# Patient Record
Sex: Female | Born: 1970 | Race: White | Hispanic: No | Marital: Married | Smoking: Never smoker
Health system: Southern US, Community
[De-identification: ages and names within clinical notes are randomized; demographics above are authoritative.]

## PROBLEM LIST (undated history)

## (undated) DIAGNOSIS — C801 Malignant (primary) neoplasm, unspecified: Secondary | ICD-10-CM

## (undated) DIAGNOSIS — Z8042 Family history of malignant neoplasm of prostate: Secondary | ICD-10-CM

## (undated) DIAGNOSIS — Z923 Personal history of irradiation: Secondary | ICD-10-CM

## (undated) HISTORY — DX: Family history of malignant neoplasm of prostate: Z80.42

---

## 2008-02-16 HISTORY — PX: LAPAROSCOPIC CHOLECYSTECTOMY: SUR755

## 2012-01-05 ENCOUNTER — Other Ambulatory Visit: Payer: Self-pay | Admitting: Obstetrics & Gynecology

## 2012-01-05 DIAGNOSIS — Z1231 Encounter for screening mammogram for malignant neoplasm of breast: Secondary | ICD-10-CM

## 2012-03-02 ENCOUNTER — Ambulatory Visit
Admission: RE | Admit: 2012-03-02 | Discharge: 2012-03-02 | Disposition: A | Discharge status: Home / Self Care | Payer: Commercial Indemnity | Source: Ambulatory Visit | Attending: Obstetrics & Gynecology | Admitting: Obstetrics & Gynecology

## 2012-03-02 DIAGNOSIS — Z1231 Encounter for screening mammogram for malignant neoplasm of breast: Secondary | ICD-10-CM

## 2013-03-01 ENCOUNTER — Other Ambulatory Visit: Payer: Self-pay

## 2013-03-01 DIAGNOSIS — Z1231 Encounter for screening mammogram for malignant neoplasm of breast: Secondary | ICD-10-CM

## 2013-03-23 ENCOUNTER — Ambulatory Visit
Admission: RE | Admit: 2013-03-23 | Discharge: 2013-03-23 | Disposition: A | Discharge status: Home / Self Care | Payer: Commercial Indemnity | Source: Ambulatory Visit

## 2013-03-23 DIAGNOSIS — Z1231 Encounter for screening mammogram for malignant neoplasm of breast: Secondary | ICD-10-CM

## 2017-06-15 DIAGNOSIS — C801 Malignant (primary) neoplasm, unspecified: Secondary | ICD-10-CM

## 2017-06-15 HISTORY — PX: BREAST BIOPSY: SHX20

## 2017-06-15 HISTORY — DX: Malignant (primary) neoplasm, unspecified: C80.1

## 2017-06-16 ENCOUNTER — Other Ambulatory Visit: Payer: Self-pay | Admitting: Obstetrics & Gynecology

## 2017-06-16 DIAGNOSIS — R928 Other abnormal and inconclusive findings on diagnostic imaging of breast: Secondary | ICD-10-CM

## 2017-06-22 ENCOUNTER — Ambulatory Visit
Admission: RE | Admit: 2017-06-22 | Discharge: 2017-06-22 | Disposition: A | Discharge status: Home / Self Care | Payer: Commercial Indemnity | Source: Ambulatory Visit | Attending: Obstetrics & Gynecology | Admitting: Obstetrics & Gynecology

## 2017-06-22 ENCOUNTER — Ambulatory Visit
Admission: RE | Admit: 2017-06-22 | Discharge: 2017-06-22 | Disposition: A | Discharge status: Home / Self Care | Payer: 59 | Source: Ambulatory Visit | Attending: Obstetrics & Gynecology | Admitting: Obstetrics & Gynecology

## 2017-06-22 ENCOUNTER — Other Ambulatory Visit: Payer: Self-pay | Admitting: Obstetrics & Gynecology

## 2017-06-22 DIAGNOSIS — R928 Other abnormal and inconclusive findings on diagnostic imaging of breast: Secondary | ICD-10-CM

## 2017-06-22 DIAGNOSIS — N6310 Unspecified lump in the right breast, unspecified quadrant: Secondary | ICD-10-CM

## 2017-06-29 ENCOUNTER — Ambulatory Visit
Admission: RE | Admit: 2017-06-29 | Discharge: 2017-06-29 | Disposition: A | Discharge status: Home / Self Care | Payer: 59 | Source: Ambulatory Visit | Attending: Obstetrics & Gynecology | Admitting: Obstetrics & Gynecology

## 2017-06-29 ENCOUNTER — Other Ambulatory Visit: Payer: Self-pay | Admitting: Obstetrics & Gynecology

## 2017-06-29 DIAGNOSIS — N6310 Unspecified lump in the right breast, unspecified quadrant: Secondary | ICD-10-CM

## 2017-07-01 ENCOUNTER — Telehealth: Payer: Self-pay | Admitting: Hematology and Oncology

## 2017-07-01 ENCOUNTER — Encounter: Payer: Self-pay | Admitting: *Deleted

## 2017-07-01 NOTE — Telephone Encounter (Signed)
LVM for patient in reference to morning [facility] appointment on 5/22, packet mailed to patient

## 2017-07-05 ENCOUNTER — Other Ambulatory Visit: Payer: Self-pay

## 2017-07-06 ENCOUNTER — Encounter: Payer: Self-pay | Admitting: Hematology and Oncology

## 2017-07-06 ENCOUNTER — Inpatient Hospital Stay: Payer: 59 | Attending: Hematology and Oncology | Admitting: Hematology and Oncology

## 2017-07-06 ENCOUNTER — Ambulatory Visit
Admission: RE | Admit: 2017-07-06 | Discharge: 2017-07-06 | Disposition: A | Discharge status: Home / Self Care | Payer: 59 | Source: Ambulatory Visit | Attending: Radiation Oncology | Admitting: Radiation Oncology

## 2017-07-06 ENCOUNTER — Ambulatory Visit: Payer: 59 | Attending: Surgery | Admitting: Physical Therapy

## 2017-07-06 ENCOUNTER — Other Ambulatory Visit: Payer: Self-pay | Admitting: *Deleted

## 2017-07-06 ENCOUNTER — Ambulatory Visit: Payer: Self-pay | Admitting: Surgery

## 2017-07-06 ENCOUNTER — Encounter: Payer: Self-pay | Admitting: Radiation Oncology

## 2017-07-06 ENCOUNTER — Encounter: Payer: Self-pay | Admitting: Physical Therapy

## 2017-07-06 ENCOUNTER — Other Ambulatory Visit: Payer: Self-pay

## 2017-07-06 ENCOUNTER — Encounter: Payer: Self-pay | Admitting: *Deleted

## 2017-07-06 ENCOUNTER — Inpatient Hospital Stay: Payer: 59

## 2017-07-06 DIAGNOSIS — C50211 Malignant neoplasm of upper-inner quadrant of right female breast: Secondary | ICD-10-CM

## 2017-07-06 DIAGNOSIS — Z170 Estrogen receptor positive status [ER+]: Secondary | ICD-10-CM | POA: Insufficient documentation

## 2017-07-06 DIAGNOSIS — R293 Abnormal posture: Secondary | ICD-10-CM | POA: Insufficient documentation

## 2017-07-06 DIAGNOSIS — C50911 Malignant neoplasm of unspecified site of right female breast: Secondary | ICD-10-CM

## 2017-07-06 DIAGNOSIS — Z79899 Other long term (current) drug therapy: Secondary | ICD-10-CM | POA: Insufficient documentation

## 2017-07-06 LAB — CMP (CANCER CENTER ONLY)
ALBUMIN: 3.6 g/dL (ref 3.5–5.0)
ALK PHOS: 81 U/L (ref 40–150)
ALT: 25 U/L (ref 0–55)
AST: 25 U/L (ref 5–34)
Anion gap: 11 (ref 3–11)
BUN: 10 mg/dL (ref 7–26)
CALCIUM: 8.9 mg/dL (ref 8.4–10.4)
CO2: 22 mmol/L (ref 22–29)
Chloride: 109 mmol/L (ref 98–109)
Creatinine: 0.9 mg/dL (ref 0.60–1.10)
GFR, Estimated: >60 mL/min (ref >60)
GLUCOSE: 103 mg/dL (ref 70–140)
POTASSIUM: 4.4 mmol/L (ref 3.5–5.1)
SODIUM: 142 mmol/L (ref 136–145)
TOTAL PROTEIN: 6.8 g/dL (ref 6.4–8.3)
Total Bilirubin: 0.4 mg/dL (ref 0.2–1.2)

## 2017-07-06 LAB — CBC WITH DIFFERENTIAL (CANCER CENTER ONLY)
Basophils Absolute: 0 10*3/uL (ref 0.0–0.1)
Basophils Relative: 1 %
EOS ABS: 0.1 10*3/uL (ref 0.0–0.5)
EOS PCT: 1 %
HCT: 40.4 % (ref 34.8–46.6)
Hemoglobin: 13.3 g/dL (ref 11.6–15.9)
LYMPHS PCT: 24 %
Lymphs Abs: 1.9 10*3/uL (ref 0.9–3.3)
MCH: 27.9 pg (ref 25.1–34.0)
MCHC: 32.9 g/dL (ref 31.5–36.0)
MCV: 84.9 fL (ref 79.5–101.0)
MONO ABS: 0.3 10*3/uL (ref 0.1–0.9)
MONOS PCT: 4 %
NEUTROS PCT: 70 %
Neutro Abs: 5.6 10*3/uL (ref 1.5–6.5)
PLATELETS: 258 10*3/uL (ref 145–400)
RBC: 4.76 MIL/uL (ref 3.70–5.45)
RDW: 13.9 % (ref 11.2–14.5)
WBC Count: 8 10*3/uL (ref 3.9–10.3)

## 2017-07-06 NOTE — Progress Notes (Signed)
Clinical Social Work [place] Psychosocial Distress Screening [place]  Patient completed distress screening protocol and scored a 4 on the Psychosocial Distress Thermometer which indicates mild distress. Clinical Social Worker met with patient and patients husband in [facility] to assess for distress and other psychosocial needs. Patient stated she was feeling overwhelmed but felt "better" after meeting with the treatment team and getting more information on her treatment plan. CSW and patient discussed common feeling and emotions when being diagnosed with cancer, and the importance of support during treatment. CSW informed patient of the support team and support services at [facility]. CSW provided contact information and encouraged patient to call with any questions or concerns.  ONCBCN DISTRESS SCREENING 07/06/2017  Screening Type Initial Screening  Distress experienced in past week (1-10) 4  Practical problem type Childcare  Emotional problem type Nervousness/Anxiety;Adjusting to illness  Information Concerns Type Lack of info about treatment  Physician notified of physical symptoms Yes     Johnnye Lana, MSW, LCSW, OSW-C Clinical Social Worker [place] [phone]

## 2017-07-06 NOTE — Progress Notes (Signed)
Nutrition Assessment  Reason for Assessment:  Pt seen in Breast Clinic  ASSESSMENT:   47 year old female with new diagnosis of breast cancer.  Past medical history reviewed  Patient reports normal appetite  Medications:  reviewed  Labs: reviewed  Anthropometrics:   Height: 62.5 inches Weight: 179 lb 9.6 oz BMI: 32   NUTRITION DIAGNOSIS: Food and nutrition related knowledge deficit related to new diagnosis of breast cancer as evidenced by no prior need for nutrition related information.  INTERVENTION:   Discussed and provided packet of information regarding nutritional tips for breast cancer patients.  Questions answered.  Teachback method used.  Contact information provided and patient knows to contact me with questions/concerns.    MONITORING, EVALUATION, and GOAL: Pt will consume a healthy plant based diet to maintain lean body mass throughout treatment.   Virgia Kelner B. Zenia Resides, [place], [place] Registered Dietitian [phone] (pager)

## 2017-07-06 NOTE — Progress Notes (Signed)
Radiation Oncology         [phone] ________________________________  Initial Outpatient Consultation  Name: Melissa [name] MRN: [id number]  Date: 07/06/2017  DOB: 08/19/1970  [id number], [name], MD  Alphonsa Overall, MD   REFERRING PHYSICIAN: Alphonsa Overall, MD  DIAGNOSIS:    ICD-10-CM   1. 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 female who presented with screening detected right breast mass. Diagnostic mammogram and ultrasound of the right breast on 06/22/17 showed a 7 mm mass in the 2:00 location, 8 cm from the nipple. Right axilla was negative on ultrasound. Biopsy of the mass on 06/29/17 revealed invasive ductal carcinoma with characteristics as described above in the diagnosis.  On review of systems, the patient is positive for back pain. She wears contacts.  GYN/OB History: She had her first menstrual period at age 73. She is still having periods. Approximate date of her last period is 04/11/17. Her periods are not regular. She has carried one child to term at age 61. She is not currently trying to get pregnant. She has used birth control pills as contraception for the past 5 years. Will stop this now.  PREVIOUS RADIATION THERAPY: No  PAST MEDICAL HISTORY:  has no past medical history on file.    PAST SURGICAL HISTORY: Past Surgical History:  Procedure Laterality Date  . CESAREAN SECTION    . CHOLECYSTECTOMY      FAMILY HISTORY: family history includes Prostate cancer in her father.  SOCIAL HISTORY:  reports that she has never smoked. She has never used smokeless tobacco. She reports that she drinks about 0.6 oz of alcohol per week. She reports that she does not use  drugs. Married with 1 son. Homemaker.  ALLERGIES: Patient has no known allergies.  MEDICATIONS:  Current Outpatient Medications  Medication Sig Dispense Refill  . acetaminophen (TYLENOL) 500 MG tablet Take 500 mg by mouth every 6 (six) hours as needed.    . fexofenadine (ALLEGRA) 180 MG tablet Take 180 mg by mouth daily as needed for allergies or rhinitis.    . naproxen sodium (ALEVE) 220 MG tablet Take 220 mg by mouth as needed.    . Norethindrone-Ethinyl Estradiol-Fe Biphas (LO LOESTRIN FE) 1 MG-10 MCG / 10 MCG tablet Take 1 tablet by mouth daily.     No current facility-administered medications for this encounter.     REVIEW OF SYSTEMS: A 10+ POINT REVIEW OF SYSTEMS WAS OBTAINED including neurology, dermatology, psychiatry, cardiac, respiratory, lymph, extremities, GI, GU, Musculoskeletal, constitutional, breasts, reproductive, HEENT.  All pertinent positives are noted in the HPI.  All others are negative.   PHYSICAL EXAM:  Vitals with BMI 07/06/2017  Height 5' 2.5"  Weight 179 lbs 10 oz  BMI 38.75  Systolic 643  Diastolic 92  Pulse 71  Respirations 17   General: Alert and oriented, in no acute distress. HEENT: Head is normocephalic. Extraocular movements are intact. Oropharynx is clear. Neck: Neck is supple, no palpable cervical or supraclavicular lymphadenopathy. Heart: Regular in rate and rhythm with no murmurs, rubs, or gallops. Chest: Clear to auscultation bilaterally, with no rhonchi, wheezes, or rales. Abdomen: Soft, nontender, nondistended, with no rigidity or guarding. Extremities: No cyanosis or edema.  Lymphatics: see Neck Exam Skin: No concerning lesions. Musculoskeletal: Symmetric strength and muscle tone throughout. Neurologic: Cranial nerves II through XII are grossly intact. No obvious focalities. Speech is fluent. Coordination is intact. Psychiatric: Judgment and insight are intact. Affect is appropriate. Breasts: Post-biopsy bruising in the medial right  breast. No palpable masses.    ECOG = 0  0 - Asymptomatic (Fully active, able to carry on all predisease activities without restriction)  1 - Symptomatic but completely ambulatory (Restricted in physically strenuous activity but ambulatory and able to carry out work of a light or sedentary nature. For example, light housework, office work)  2 - Symptomatic, <50% in bed during the day (Ambulatory and capable of all self care but unable to carry out any work activities. Up and about more than 50% of waking hours)  3 - Symptomatic, >50% in bed, but not bedbound (Capable of only limited self-care, confined to bed or chair 50% or more of waking hours)  4 - Bedbound (Completely disabled. Cannot carry on any self-care. Totally confined to bed or chair)  5 - Death   Eustace Pen MM, Creech RH, Tormey DC, et al. [phone]). "Toxicity and response criteria of the [facility]". [place] Oncol. 5 (6): 649-55   LABORATORY DATA:  Lab Results  Component Value Date   WBC 8.0 07/06/2017   HGB 13.3 07/06/2017   HCT 40.4 07/06/2017   MCV 84.9 07/06/2017   PLT 258 07/06/2017   CMP     Component Value Date/Time   NA 142 07/06/2017 0820   K 4.4 07/06/2017 0820   CL 109 07/06/2017 0820   CO2 22 07/06/2017 0820   GLUCOSE 103 07/06/2017 0820   BUN 10 07/06/2017 0820   CREATININE 0.90 07/06/2017 0820   CALCIUM 8.9 07/06/2017 0820   PROT 6.8 07/06/2017 0820   ALBUMIN 3.6 07/06/2017 0820   AST 25 07/06/2017 0820   ALT 25 07/06/2017 0820   ALKPHOS 81 07/06/2017 0820   BILITOT 0.4 07/06/2017 0820   GFRNONAA >60 07/06/2017 0820   GFRAA >60 07/06/2017 0820         RADIOGRAPHY: US Breast Ltd Uni Right Inc Axilla  Result Date: 06/22/2017 CLINICAL DATA:  Patient returns after screening study for evaluation of a possible RIGHT breast mass. EXAM: DIGITAL DIAGNOSTIC RIGHT MAMMOGRAM WITH CAD AND TOMO ULTRASOUND RIGHT BREAST COMPARISON:  06/15/2017 and earlier ACR Breast Density Category  b: There are scattered areas of fibroglandular density. FINDINGS: Additional 2-D and 3-D images are performed. These views confirm presence of a spiculated mass in the MEDIAL portion of the RIGHT breast. Mammographic images were processed with CAD. On physical exam, I palpate no abnormality in the MEDIAL aspect of the RIGHT breast. Targeted ultrasound is performed, showing an irregular hypoechoic mass with posterior acoustic shadowing in the 2 o'clock location of the RIGHT breast 8 centimeters from the nipple. There is no significant internal blood flow. Mass measures 0.7 x 0.6 x 0.6 centimeters. Evaluation of the RIGHT axilla shows lymph nodes with normal morphology. IMPRESSION: Suspicious mass in the 2 o'clock location of the RIGHT breast. RECOMMENDATION: Ultrasound-guided core biopsy of the RIGHT breast, scheduled for the patient I have discussed the findings and recommendations with the patient. Results were also provided in writing at the conclusion of the visit. If applicable, a reminder letter will be sent to the patient regarding the next appointment. BI-RADS CATEGORY  4: Suspicious. Electronically Signed   By: Nolon Nations M.D.   On:  06/22/2017 15:58   Mm Diag Breast Tomo Uni Right  Result Date: 06/22/2017 CLINICAL DATA:  Patient returns after screening study for evaluation of a possible RIGHT breast mass. EXAM: DIGITAL DIAGNOSTIC RIGHT MAMMOGRAM WITH CAD AND TOMO ULTRASOUND RIGHT BREAST COMPARISON:  06/15/2017 and earlier ACR Breast Density Category b: There are scattered areas of fibroglandular density. FINDINGS: Additional 2-D and 3-D images are performed. These views confirm presence of a spiculated mass in the MEDIAL portion of the RIGHT breast. Mammographic images were processed with CAD. On physical exam, I palpate no abnormality in the MEDIAL aspect of the RIGHT breast. Targeted ultrasound is performed, showing an irregular hypoechoic mass with posterior acoustic shadowing in the 2 o'clock  location of the RIGHT breast 8 centimeters from the nipple. There is no significant internal blood flow. Mass measures 0.7 x 0.6 x 0.6 centimeters. Evaluation of the RIGHT axilla shows lymph nodes with normal morphology. IMPRESSION: Suspicious mass in the 2 o'clock location of the RIGHT breast. RECOMMENDATION: Ultrasound-guided core biopsy of the RIGHT breast, scheduled for the patient I have discussed the findings and recommendations with the patient. Results were also provided in writing at the conclusion of the visit. If applicable, a reminder letter will be sent to the patient regarding the next appointment. BI-RADS CATEGORY  4: Suspicious. Electronically Signed   By: Nolon Nations M.D.   On: 06/22/2017 15:58   Mm Clip Placement Right  Result Date: 06/29/2017 CLINICAL DATA:  Evaluate biopsy marker EXAM: DIAGNOSTIC RIGHT MAMMOGRAM POST ULTRASOUND BIOPSY COMPARISON:  Previous exam(s). FINDINGS: Mammographic images were obtained following ultrasound guided biopsy of a right breast mass. The biopsy clip is in close proximity to the biopsied mass. It is difficult to see the mass by the end of the study in the clip is likely not directly in the mass but within 1 cm. IMPRESSION: The biopsy clip is within 1 cm of the biopsied mass. The clip is not within the mass itself as the mass was very difficult to see by the end of the study. Final Assessment: Post Procedure Mammograms for Marker Placement Electronically Signed   By: Dorise Bullion III M.D   On: 06/29/2017 13:42   [place] Rt Breast Bx W Loc Dev 1st Lesion Img Bx Spec US Guide  Addendum Date: 06/30/2017   ADDENDUM REPORT: 06/30/2017 14:10 ADDENDUM: Pathology revealed GRADE I INVASIVE DUCTAL CARCINOMA Right breast, 2 o'clock, 8 cmfn. This was found to be concordant by Dr. Dorise Bullion. Pathology results were discussed with the patient by telephone. The patient reported doing well after the biopsy with tenderness at the site. Post biopsy instructions and  care were reviewed and questions were answered. The patient was encouraged to call [facility] for any additional concerns. The patient was referred to [facility] on Jul 06, 2017. Pathology results reported by Terie Purser, RN on 06/30/2017. Electronically Signed   By: Dorise Bullion III M.D   On: 06/30/2017 14:10   Result Date: 06/30/2017 CLINICAL DATA:  Spiculated mass in the right breast EXAM: ULTRASOUND GUIDED RIGHT BREAST CORE NEEDLE BIOPSY COMPARISON:  Previous exam(s). FINDINGS: I met with the patient and we discussed the procedure of ultrasound-guided biopsy, including benefits and alternatives. We discussed the high likelihood of a successful procedure. We discussed the risks of the procedure, including infection, bleeding, tissue injury, clip migration, and inadequate sampling. Informed written consent was given. The usual time-out protocol was performed  immediately prior to the procedure. Lesion quadrant: Medial superior Using sterile technique and 1% Lidocaine as local anesthetic, under direct ultrasound visualization, a 12 gauge spring-loaded device was used to perform biopsy of the spiculated mass in the medial superior right breast using a medial approach. At the conclusion of the procedure a ribbon shaped tissue marker clip was deployed into the biopsy cavity. Follow up 2 view mammogram was performed and dictated separately. IMPRESSION: Ultrasound guided biopsy of a right breast mass. No apparent complications. Electronically Signed: By: Dorise Bullion III M.D On: 06/29/2017 14:16      IMPRESSION/PLAN: Right Breast Cancer   She has been discussed at our multidisciplinary tumor board.  The consensus is that she would be a good candidate for breast conservation. I talked to her about the option of a mastectomy and informed her that her expected overall survival would be equivalent between  mastectomy and breast conservation, based upon randomized controlled data. She is enthusiastic about breast conservation. Given her young age, she will also see genetics tomorrow for testing.  It was a pleasure meeting the patient today. We discussed the risks, benefits, and side effects of radiotherapy. I recommend radiotherapy to the right breast to reduce her risk of locoregional recurrence by 2/3.  We discussed that radiation would take approximately 4 weeks to complete and that I would give the patient a few weeks to heal following surgery before starting treatment planning. We spoke about acute effects including skin irritation and fatigue as well as much less common late effects including internal organ injury or irritation. We spoke about the latest technology that is used to minimize the risk of late effects for patients undergoing radiotherapy to the breast or chest wall. No guarantees of treatment were given. The patient is enthusiastic about proceeding with treatment. I look forward to participating in the patient's care.  I will await her referral back to me for postoperative follow-up and eventual CT simulation/treatment planning.  She is also enthusiastic about adjuvant anti-estrogen therapy following radiotherapy and will see Dr. [name] for this.  She will stop her birth control pills.  Advised to use condoms for now, for birth control, and avoid hormonal interventions for this.      __________________________________________   Eppie Gibson, MD  This document serves as a record of services personally performed by Eppie Gibson, MD. It was created on her behalf by Rae Lips, a trained medical scribe. The creation of this record is based on the scribe's personal observations and the provider's statements to them. This document has been checked and approved by the attending provider.

## 2017-07-06 NOTE — Patient Instructions (Signed)

## 2017-07-06 NOTE — Therapy (Signed)
[place], [place], [address] Phone: [phone]   Fax:  [phone]  Physical Therapy Evaluation  Patient Details  Name: Melissa [name] MRN: [id number] Date of Birth: [birth year] Referring Provider: Dr. Alphonsa Overall   Encounter Date: 07/06/2017  PT End of Session - 07/06/17 1117    Visit Number  1    Number of Visits  2    Date for PT Re-Evaluation  08/31/17    PT Start Time  1025    PT Stop Time  1058    PT Time Calculation (min)  33 min    Activity Tolerance  Patient tolerated treatment well    Behavior During Therapy  [facility] for tasks assessed/performed       History reviewed. No pertinent past medical history.  Past Surgical History:  Procedure Laterality Date  . CESAREAN SECTION    . CHOLECYSTECTOMY      There were no vitals filed for this visit.   Subjective Assessment - 07/06/17 1101    Subjective  Patient reports she is here today to be seen by her medical team for her newly diagnosed right breast cancer.    Patient is accompained by:  Family member    Pertinent History  Patient was diagnosed on 06/15/17 with right grade I invasive ductal carcinoma breast cancer. It measures 7 mm and is located in the upper inner quadrant. It is ER/PR positive and HER2 negative with a Ki67 of 5%. She has no other medical problems.    Patient Stated Goals  Reduce lymphedema risk and learn post op shoulder ROM HEP    Currently in Pain?  No/denies         [facility] PT Assessment - 07/06/17 0001      Assessment   Medical Diagnosis  Right breast cancer    Referring Provider  Dr. Alphonsa Overall    Onset Date/Surgical Date  06/15/17    Hand Dominance  Right    Prior Therapy  none      Precautions   Precautions  Other (comment)    Precaution Comments  active cancer      Restrictions   Weight Bearing Restrictions  No      Balance Screen   Has the patient fallen in the past 6 months  No    Has the  patient had a decrease in activity level because of a fear of falling?   No    Is the patient reluctant to leave their home because of a fear of falling?   No      Home Social worker  Private residence    Living Arrangements  Spouse/significant other;Children Husband and 59 y.o. son    Available Help at Discharge  Family      Prior Function   Level of [place]  Unemployed    Leisure  She plays tennis 3-4x/week and walks 30 min once a week      Cognition   Overall Cognitive Status  Within Functional Limits for tasks assessed      Posture/Postural Control   Posture/Postural Control  Postural limitations    Postural Limitations  Rounded Shoulders;Forward head      ROM / Strength   AROM / PROM / Strength  AROM;Strength      AROM   AROM Assessment Site  Shoulder;Cervical    Right/Left Shoulder  Right;Left    Right Shoulder Extension  40 Degrees  Right Shoulder Flexion  148 Degrees    Right Shoulder ABduction  145 Degrees    Right Shoulder Internal Rotation  71 Degrees    Right Shoulder External Rotation  82 Degrees    Left Shoulder Extension  46 Degrees    Left Shoulder Flexion  135 Degrees    Left Shoulder ABduction  146 Degrees    Left Shoulder Internal Rotation  77 Degrees    Left Shoulder External Rotation  80 Degrees    Cervical Flexion  WNL    Cervical Extension  WNL    Cervical - Right Side Bend  WNL    Cervical - Left Side Bend  WNL    Cervical - Right Rotation  WNL    Cervical - Left Rotation  WNL      Strength   Overall Strength  Within functional limits for tasks performed        LYMPHEDEMA/ONCOLOGY QUESTIONNAIRE - 07/06/17 1115      Type   Cancer Type  Right breast cancer      Lymphedema Assessments   Lymphedema Assessments  Upper extremities      Right Upper Extremity Lymphedema   10 cm Proximal to Olecranon Process  30.5 cm    Olecranon Process  26.2 cm    10 cm Proximal to Ulnar Styloid Process   23.4 cm    Just Proximal to Ulnar Styloid Process  15.1 cm    Across Hand at [employer]  19.2 cm    At [place] of 2nd Digit  6.1 cm      Left Upper Extremity Lymphedema   10 cm Proximal to Olecranon Process  28.5 cm    Olecranon Process  24.7 cm    10 cm Proximal to Ulnar Styloid Process  23.2 cm    Just Proximal to Ulnar Styloid Process  14.8 cm    Across Hand at [employer]  18.6 cm    At [place] of 2nd Digit  6.2 cm             Objective measurements completed on examination: See above findings.    Patient was instructed today in a home exercise program today for post op shoulder range of motion. These included active assist shoulder flexion in sitting, scapular retraction, wall walking with shoulder abduction, and hands behind head external rotation.  She was encouraged to do these twice a day, holding 3 seconds and repeating 5 times when permitted by her physician.     PT Education - 07/06/17 1116    Education provided  Yes    Education Details  Lymphedema risk reduction and post op shoulder ROM HEP    Person(s) Educated  Patient    Methods  Explanation;Demonstration;Handout    Comprehension  Returned demonstration;Verbalized understanding          PT Long Term Goals - 07/06/17 1124      PT LONG TERM GOAL #1   Title  Patient will demonstrate she has returned to baseline related to shoulder ROM and function post operatively.    Time  [facility] Goals - 07/06/17 1124      Patient will be able to verbalize understanding of pertinent lymphedema risk reduction practices relevant to her diagnosis specifically related to skin care.   Time  1    Period  Days    Status  Achieved  Patient will be able to return demonstrate and/or verbalize understanding of the post-op home exercise program related to regaining shoulder range of motion.   Time  1    Period  Days    Status  Achieved      Patient will be able to  verbalize understanding of the importance of attending the postoperative After Breast Cancer Class for further lymphedema risk reduction education and therapeutic exercise.   Time  1    Period  Days    Status  Achieved            Plan - 07/06/17 1117    Clinical Impression Statement  Patient was diagnosed on 06/15/17 with right grade I invasive ductal carcinoma breast cancer. It measures 7 mm and is located in the upper inner quadrant. It is ER/PR positive and HER2 negative with a Ki67 of 5%. She has no other medical problems. Her multidisciplinary medical team met prior to her assessments to determine a recommended treatment plan. She is planning to have a right lumpectomy and sentinel node biopsy followed by radiation and anti-estrogen therapy.    History and Personal Factors relevant to plan of care:  None    Clinical Presentation  Stable    Clinical Decision Making  Low    Rehab Potential  Excellent    Clinical Impairments Affecting Rehab Potential  None    PT Frequency  -- Eval and 1 f/u visit    PT Treatment/Interventions  ADLs/Self Care Home Management;Therapeutic exercise;Patient/family education    PT Next Visit Plan  Will f/u 3-4 weeks post op to reassess and determine needs    PT Home Exercise Plan  Post op shoulder ROM HEP    Consulted and Agree with Plan of Care  Patient;Family member/caregiver    Family Member Consulted  Husband       Patient will benefit from skilled therapeutic intervention in order to improve the following deficits and impairments:  Impaired UE functional use, Decreased knowledge of precautions, Decreased range of motion, Postural dysfunction, Pain  Visit Diagnosis: Carcinoma of upper-inner quadrant of right breast in female, estrogen receptor positive ([place]) - Plan: PT plan of care cert/re-cert  Abnormal posture - Plan: PT plan of care cert/re-cert   Patient will follow up at outpatient cancer rehab 3-4 weeks following surgery.  If the patient  requires physical therapy at that time, a specific plan will be dictated and sent to the referring physician for approval. The patient was educated today on appropriate basic range of motion exercises to begin post operatively and the importance of attending the After Breast Cancer class following surgery.  Patient was educated today on lymphedema risk reduction practices as it pertains to recommendations that will benefit the patient immediately following surgery.  She verbalized good understanding.      Problem List Patient Active Problem List   Diagnosis Date Noted  . Malignant neoplasm of upper-inner quadrant of right breast in female, estrogen receptor positive ([place]) 07/06/2017    Annia Friendly, PT 07/06/17 11:26 AM  [place], [place], [address] Phone: [phone]   Fax:  [phone]  Name: Melissa [name] MRN: [id number] Date of Birth: 08-14-1970

## 2017-07-06 NOTE — Progress Notes (Signed)
[facility] NOTE  Patient Care Team: [name], [name], MD as PCP - General (Family Medicine) Alphonsa Overall, MD as Consulting Physician (General Surgery) [name], MD as Consulting Physician (Hematology and Oncology) Eppie Gibson, MD as Attending Physician (Radiation Oncology)  CHIEF COMPLAINTS/PURPOSE OF CONSULTATION:  Newly diagnosed breast cancer  HISTORY OF PRESENTING ILLNESS:  Melissa [name] 47 y.o. female is here because of recent diagnosis of right breast cancer.  Patient had a routine screening mammogram that detected a right breast mass at 2 o'clock position measuring 0.7 cm.  Ultrasound of the axilla was negative.  Biopsy revealed invasive ductal carcinoma grade 1 that was ER 70% positive, PR 100% positive, Ki-67 5%, HER-2 negative ratio 1.19.  She was presented at the multidisciplinary conference this morning and she is here at the [facility] clinic to discuss her treatment plan.  I reviewed her records extensively and collaborated the history with the patient.  SUMMARY OF ONCOLOGIC HISTORY:   Malignant neoplasm of upper-inner quadrant of right breast in female, estrogen receptor positive ([place])   06/29/2017 Initial Diagnosis    Screening detected right breast mass at 2 o'clock position 0.7 cm, axillary ultrasound negative.  Biopsy revealed IDC grade 1 ER 90%, PR 100%, Ki-67 5%, HER-2 negative ratio 1.19, T1 BN 0 stage I a clinical stage AJCC 8       MEDICAL HISTORY:  History reviewed. No pertinent past medical history.  SURGICAL HISTORY: Past Surgical History:  Procedure Laterality Date  . CESAREAN SECTION    . CHOLECYSTECTOMY      SOCIAL HISTORY: Social History   Socioeconomic History  . Marital status: Married    Spouse name: Not on file  . Number of children: Not on file  . Years of education: Not on file  . Highest education level: Not on file  Occupational History  . Not on file  Social Needs  . Financial resource strain: Not on  file  . Food insecurity:    Worry: Not on file    Inability: Not on file  . Transportation needs:    Medical: Not on file    Non-medical: Not on file  Tobacco Use  . Smoking status: Never Smoker  . Smokeless tobacco: Never Used  Substance and Sexual Activity  . Alcohol use: Yes    Alcohol/week: 0.6 oz    Types: 1 Glasses of wine per week  . Drug use: Never  . Sexual activity: Not on file  Lifestyle  . Physical activity:    Days per week: Not on file    Minutes per session: Not on file  . Stress: Not on file  Relationships  . Social connections:    Talks on phone: Not on file    Gets together: Not on file    Attends religious service: Not on file    Active member of club or organization: Not on file    Attends meetings of clubs or organizations: Not on file    Relationship status: Not on file  . Intimate partner violence:    Fear of current or ex partner: Not on file    Emotionally abused: Not on file    Physically abused: Not on file    Forced sexual activity: Not on file  Other Topics Concern  . Not on file  Social History Narrative  . Not on file    FAMILY HISTORY: Family History  Problem Relation Age of Onset  . Prostate cancer Father  ALLERGIES:  has No Known Allergies.  MEDICATIONS:  Current Outpatient Medications  Medication Sig Dispense Refill  . acetaminophen (TYLENOL) 500 MG tablet Take 500 mg by mouth every 6 (six) hours as needed.    . fexofenadine (ALLEGRA) 180 MG tablet Take 180 mg by mouth daily as needed for allergies or rhinitis.    . naproxen sodium (ALEVE) 220 MG tablet Take 220 mg by mouth as needed.    . Norethindrone-Ethinyl Estradiol-Fe Biphas (LO LOESTRIN FE) 1 MG-10 MCG / 10 MCG tablet Take 1 tablet by mouth daily.     No current facility-administered medications for this visit.     REVIEW OF SYSTEMS:   Constitutional: Denies fevers, chills or abnormal night sweats Eyes: Denies blurriness of vision, double vision or watery  eyes Ears, nose, mouth, throat, and face: Denies mucositis or sore throat Respiratory: Denies cough, dyspnea or wheezes Cardiovascular: Denies palpitation, chest discomfort or lower extremity swelling Gastrointestinal:  Denies nausea, heartburn or change in bowel habits Skin: Denies abnormal skin rashes Lymphatics: Denies new lymphadenopathy or easy bruising Neurological:Denies numbness, tingling or new weaknesses Behavioral/Psych: Mood is stable, no new changes  Breast:  Denies any palpable lumps or discharge All other systems were reviewed with the patient and are negative.  PHYSICAL EXAMINATION: ECOG PERFORMANCE STATUS: 0 - Asymptomatic  Vitals:   07/06/17 0842  BP: (!) 127/92  Pulse: 71  Resp: 17  Temp: 98.4 F (36.9 C)  SpO2: 100%   Filed Weights   07/06/17 0842  Weight: 179 lb 9.6 oz (81.5 kg)    GENERAL:alert, no distress and comfortable SKIN: skin color, texture, turgor are normal, no rashes or significant lesions EYES: normal, conjunctiva are pink and non-injected, sclera clear OROPHARYNX:no exudate, no erythema and lips, buccal mucosa, and tongue normal  NECK: supple, thyroid normal size, non-tender, without nodularity LYMPH:  no palpable lymphadenopathy in the cervical, axillary or inguinal LUNGS: clear to auscultation and percussion with normal breathing effort HEART: regular rate & rhythm and no murmurs and no lower extremity edema ABDOMEN:abdomen soft, non-tender and normal bowel sounds Musculoskeletal:no cyanosis of digits and no clubbing  PSYCH: alert & oriented x 3 with fluent speech NEURO: no focal motor/sensory deficits BREAST: No palpable nodules in breast. No palpable axillary or supraclavicular lymphadenopathy (exam performed in the presence of a chaperone)   LABORATORY DATA:  I have reviewed the data as listed Lab Results  Component Value Date   WBC 8.0 07/06/2017   HGB 13.3 07/06/2017   HCT 40.4 07/06/2017   MCV 84.9 07/06/2017   PLT 258  07/06/2017   Lab Results  Component Value Date   NA 142 07/06/2017   K 4.4 07/06/2017   CL 109 07/06/2017   CO2 22 07/06/2017    RADIOGRAPHIC STUDIES: I have personally reviewed the radiological reports and agreed with the findings in the report.  ASSESSMENT AND PLAN:  Malignant neoplasm of upper-inner quadrant of right breast in female, estrogen receptor positive ([place]) 06/29/2017:Screening detected right breast mass at 2 o'clock position 0.7 cm, axillary ultrasound negative.  Biopsy revealed IDC grade 1 ER 90%, PR 100%, Ki-67 5%, HER-2 negative ratio 1.19, T1 BN 0 stage I a clinical stage AJCC 8  Pathology and radiology counseling:Discussed with the patient, the details of pathology including the type of breast cancer,the clinical staging, the significance of ER, PR and HER-2/neu receptors and the implications for treatment. After reviewing the pathology in detail, we proceeded to discuss the different treatment options between surgery, radiation, chemotherapy,  antiestrogen therapies.  Recommendations: 1. Breast conserving surgery followed by 2. Adjuvant radiation therapy followed by 3. Adjuvant antiestrogen therapy  Return to clinic after surgery to discuss final pathology report   All questions were answered. The patient knows to call the clinic with any problems, questions or concerns.    Harriette Ohara, MD 07/06/17

## 2017-07-06 NOTE — Assessment & Plan Note (Signed)
06/29/2017:Screening detected right breast mass at 2 o'clock position 0.7 cm, axillary ultrasound negative.  Biopsy revealed IDC grade 1 ER 90%, PR 100%, Ki-67 5%, HER-2 negative ratio 1.19, T1 BN 0 stage I a clinical stage AJCC 8  Pathology and radiology counseling:Discussed with the patient, the details of pathology including the type of breast cancer,the clinical staging, the significance of ER, PR and HER-2/neu receptors and the implications for treatment. After reviewing the pathology in detail, we proceeded to discuss the different treatment options between surgery, radiation, chemotherapy, antiestrogen therapies.  Recommendations: 1. Breast conserving surgery followed by 2. Adjuvant radiation therapy followed by 3. Adjuvant antiestrogen therapy  Return to clinic after surgery to discuss final pathology report

## 2017-07-07 ENCOUNTER — Encounter: Payer: Self-pay | Admitting: Genetic Counselor

## 2017-07-07 ENCOUNTER — Inpatient Hospital Stay (HOSPITAL_BASED_OUTPATIENT_CLINIC_OR_DEPARTMENT_OTHER): Payer: 59 | Admitting: Genetic Counselor

## 2017-07-07 DIAGNOSIS — Z170 Estrogen receptor positive status [ER+]: Secondary | ICD-10-CM

## 2017-07-07 DIAGNOSIS — Z79899 Other long term (current) drug therapy: Secondary | ICD-10-CM

## 2017-07-07 DIAGNOSIS — Z8042 Family history of malignant neoplasm of prostate: Secondary | ICD-10-CM

## 2017-07-07 DIAGNOSIS — C50211 Malignant neoplasm of upper-inner quadrant of right female breast: Secondary | ICD-10-CM

## 2017-07-07 NOTE — Progress Notes (Signed)
REFERRING PROVIDER: [name], MD [place], [place] [address]  PRIMARY PROVIDER:  Aretta Nip, MD  PRIMARY REASON FOR VISIT:  1. Malignant neoplasm of upper-inner quadrant of right breast in female, estrogen receptor positive ([place])   2. Family history of prostate cancer      HISTORY OF PRESENT ILLNESS:   Melissa [name], a 47 y.o. female, was seen for a [place] cancer genetics consultation at the request of Dr. Lindi [name] due to a personal and family history of cancer.  Melissa [name] presents to clinic today to discuss the possibility of a hereditary predisposition to cancer, genetic testing, and to further clarify her future cancer risks, as well as potential cancer risks for family members.   In May 2019, at the age of 14, Melissa [name] was diagnosed with invasive ductal carcinoma of the right breast. This will be treated with lumpectomy.  She has had gallbladder disease in the past which necessitated her having her gallbladder out.  Melissa [name] is otherwise healthy.     CANCER HISTORY:    Malignant neoplasm of upper-inner quadrant of right breast in female, estrogen receptor positive ([place])   06/29/2017 Initial Diagnosis    Screening detected right breast mass at 2 o'clock position 0.7 cm, axillary ultrasound negative.  Biopsy revealed IDC grade 1 ER 90%, PR 100%, Ki-67 5%, HER-2 negative ratio 1.19, T1 BN 0 stage I a clinical stage AJCC 8        HORMONAL RISK FACTORS:  Menarche was at age 41.  First live birth at age 80.  OCP use for approximately 15-17 years.  Ovaries intact: yes.  Hysterectomy: no.  Menopausal status: premenopausal.  HRT use: 0 years. Colonoscopy: no; not examined. Mammogram within the last year: yes. Number of breast biopsies: 1. Up to date with pelvic exams:  yes. Any excessive radiation exposure in the past:  no  Past Medical History:  Diagnosis Date  . Family history of prostate cancer     Past Surgical  History:  Procedure Laterality Date  . CESAREAN SECTION    . CHOLECYSTECTOMY      Social History   Socioeconomic History  . Marital status: Married    Spouse name: [name]  . Number of children: 1  . Years of education: Not on file  . Highest education level: Not on file  Occupational History  . Not on file  Social Needs  . Financial resource strain: Not on file  . Food insecurity:    Worry: Not on file    Inability: Not on file  . Transportation needs:    Medical: Not on file    Non-medical: Not on file  Tobacco Use  . Smoking status: Never Smoker  . Smokeless tobacco: Never Used  Substance and Sexual Activity  . Alcohol use: Yes    Alcohol/week: 0.6 oz    Types: 1 Glasses of wine per week  . Drug use: Never  . Sexual activity: Not on file  Lifestyle  . Physical activity:    Days per week: Not on file    Minutes per session: Not on file  . Stress: Not on file  Relationships  . Social connections:    Talks on phone: Not on file    Gets together: Not on file    Attends religious service: Not on file    Active member of club or organization: Not on file    Attends meetings of clubs or organizations: Not on file  Relationship status: Not on file  Other Topics Concern  . Not on file  Social History Narrative  . Not on file     FAMILY HISTORY:  We obtained a detailed, 4-generation family history.  Significant diagnoses are listed below: Family History  Problem Relation Age of Onset  . Heart attack Mother 23  . Diabetes Mother   . Hypertension Mother   . Prostate cancer Father   . Diabetes Father   . Kidney failure Father   . Hypertension Father   . Cancer Paternal Uncle        NOS  . Diabetes Paternal Grandfather   . Kidney failure Paternal Grandfather     The patient has one son who is cancer free.  She has a 76 YO sister who is healthy, but has never had a mammogram.  Both parents are deceased.    The patient's mother died of a heart attack at 100.   She had a brother and sister.  The brother died around age 49 from unknown causes.  Both maternal grandparents have died.  The grandfather died in his 94's from a ruptured hernia and the grandmother may have had cancer.  The patient's father died at 12 from kidney failure.  He had prostate cancer at 36.  He had one brother who had an unknown form of cancer.  Both paternal grandparents are deceased.  The grandmother died of non cancer related issues and the grandfather died of kidney failure at 17.  Melissa [name] is unaware of previous family history of genetic testing for hereditary cancer risks. Patient's ancestors are of [place], [place] and [place] descent. There is no reported Ashkenazi Jewish ancestry. There is no known consanguinity.  GENETIC COUNSELING ASSESSMENT: Melissa [name] is a 47 y.o. female with a personal history of breast cancer and family history of cancer which is somewhat suggestive of a hereditary cancer syndrome and predisposition to cancer. We, therefore, discussed and recommended the following at today's visit.   DISCUSSION: We discussed that about 5-10% of breast cancer is hereditary with most cases due to BRCA mutations.  Other genes, such as ATM, CHEK2 and PALB2 can also be associated with hereditary breast cancer syndromes.  We reviewed the characteristics, features and inheritance patterns of hereditary cancer syndromes. We also discussed genetic testing, including the appropriate family members to test, the process of testing, insurance coverage and turn-around-time for results. We discussed the implications of a negative, positive and/or variant of uncertain significant result. In order to get genetic test results in a timely manner so that Melissa [name] can use these genetic test results for surgical decisions, we recommended Melissa [name] pursue genetic testing for the 9-gene STAT panel. If this test is negative, we then recommend Melissa [name] pursue reflex genetic testing  to the Multi cancer gene panel. The Multi-Gene Panel offered by [facility] includes sequencing and/or deletion duplication testing of the following 83 genes: ALK, APC, ATM, AXIN2,BAP1,  BARD1, BLM, BMPR1A, BRCA1, BRCA2, BRIP1, CASR, CDC73, CDH1, CDK4, CDKN1B, CDKN1C, CDKN2A (p14ARF), CDKN2A (p16INK4a), CEBPA, CHEK2, CTNNA1, DICER1, DIS3L2, EGFR (c.2369C>T, p.Thr790Met variant only), EPCAM (Deletion/duplication testing only), FH, FLCN, GATA2, GPC3, GREM1 (Promoter region deletion/duplication testing only), HOXB13 (c.251G>A, p.Gly84Glu), HRAS, KIT, MAX, MEN1, MET, MITF (c.952G>A, p.Glu318Lys variant only), MLH1, MSH2, MSH3, MSH6, MUTYH, NBN, NF1, NF2, NTHL1, PALB2, PDGFRA, PHOX2B, PMS2, POLD1, POLE, POT1, PRKAR1A, PTCH1, PTEN, RAD50, RAD51C, RAD51D, RB1, RECQL4, RET, RUNX1, SDHAF2, SDHA (sequence changes only), SDHB, SDHC, SDHD, SMAD4, SMARCA4, SMARCB1, SMARCE1, STK11, SUFU, TERT, TERT,  TMEM127, TP53, TSC1, TSC2, VHL, WRN and WT1.    Based on Melissa [name]'s personal and family history of cancer, she meets the ASBrS recent guidelines for genetic testing, and should meet the medical criteria for genetic testing for AETNA, based on a 10% risk for a BRCA mutation. Despite that she meets criteria, she may still have an out of pocket cost. We discussed that if her out of pocket cost for testing is over $100, the laboratory will call and confirm whether she wants to proceed with testing.  If the out of pocket cost of testing is less than $100 she will be billed by the genetic testing laboratory.   In order to estimate her chance of having a BRCA mutation, we used statistical models (Penn II) and laboratory data that take into account her personal medical history, family history and ancestry.  Because each model is different, there can be a lot of variability in the risks they give.  Therefore, these numbers must be considered a rough range and not a precise risk of having a BRCA mutation.  These models estimate that she  has approximately a 10% chance of having a mutation. Based on this assessment of her family and personal history, genetic testing is recommended.   PLAN: After considering the risks, benefits, and limitations, Melissa [name]  provided informed consent to pursue genetic testing and the blood sample was sent to [facility] for analysis of the STAT panel, with a reflex to the Multi-cancer gene panel. Results should be available within approximately 5-7 days for the STAT panel, and up to 2-3 weeks' time for the remainder of the panel, at which point they will be disclosed by telephone to Melissa [name], as will any additional recommendations warranted by these results. Melissa [name] will receive a summary of her genetic counseling visit and a copy of her results once available. This information will also be available in Epic. We encouraged Melissa [name] to remain in contact with cancer genetics annually so that we can continuously update the family history and inform her of any changes in cancer genetics and testing that may be of benefit for her family. Melissa [name] questions were answered to her satisfaction today. Our contact information was provided should additional questions or concerns arise.  Lastly, we encouraged Melissa [name] to remain in contact with cancer genetics annually so that we can continuously update the family history and inform her of any changes in cancer genetics and testing that may be of benefit for this family.   Ms.  [name] questions were answered to her satisfaction today. Our contact information was provided should additional questions or concerns arise. Thank you for the referral and allowing [place] to share in the care of your patient.   Lovelee Forner P. Florene Glen, [place], [facility] Certified Genetic Counselor [email or web address] phone: [phone]  The patient was seen for a total of 45 minutes in face-to-face genetic counseling.  This patient was discussed with Drs.  Magrinat, Lindi [name] and/or Burr Medico who agrees with the above.    _______________________________________________________________________ For Office Staff:  Number of people involved in session: 1 Was an Intern/ student involved with case: no

## 2017-07-08 ENCOUNTER — Other Ambulatory Visit: Payer: Self-pay | Admitting: Surgery

## 2017-07-08 ENCOUNTER — Encounter: Payer: Self-pay | Admitting: Genetic Counselor

## 2017-07-08 DIAGNOSIS — Z170 Estrogen receptor positive status [ER+]: Principal | ICD-10-CM

## 2017-07-08 DIAGNOSIS — C50911 Malignant neoplasm of unspecified site of right female breast: Secondary | ICD-10-CM

## 2017-07-13 ENCOUNTER — Encounter: Payer: Self-pay | Admitting: Genetic Counselor

## 2017-07-13 ENCOUNTER — Telehealth: Payer: Self-pay | Admitting: Genetic Counselor

## 2017-07-13 DIAGNOSIS — Z1379 Encounter for other screening for genetic and chromosomal anomalies: Secondary | ICD-10-CM | POA: Insufficient documentation

## 2017-07-13 NOTE — Telephone Encounter (Signed)
Revealed negative genetic testing.  Discussed that we do not know why she has breast cancer or why there is cancer in the family. It could be due to a different gene that we are not testing, or maybe our current technology may not be able to pick something up. We have reflexed to a larger gene panel.  We will contact her when this is available.

## 2017-07-13 NOTE — Telephone Encounter (Signed)
LM on VM with good news.  Asked that she CB. 

## 2017-07-14 ENCOUNTER — Encounter: Payer: Self-pay | Admitting: Radiation Oncology

## 2017-07-14 ENCOUNTER — Telehealth: Payer: Self-pay | Admitting: Genetic Counselor

## 2017-07-14 ENCOUNTER — Ambulatory Visit: Payer: Self-pay | Admitting: Genetic Counselor

## 2017-07-14 ENCOUNTER — Telehealth: Payer: Self-pay | Admitting: *Deleted

## 2017-07-14 DIAGNOSIS — Z8042 Family history of malignant neoplasm of prostate: Secondary | ICD-10-CM

## 2017-07-14 DIAGNOSIS — Z170 Estrogen receptor positive status [ER+]: Principal | ICD-10-CM

## 2017-07-14 DIAGNOSIS — Z1379 Encounter for other screening for genetic and chromosomal anomalies: Secondary | ICD-10-CM

## 2017-07-14 DIAGNOSIS — C50211 Malignant neoplasm of upper-inner quadrant of right female breast: Secondary | ICD-10-CM

## 2017-07-14 NOTE — Telephone Encounter (Signed)
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  Discussed that this may moderately increase her risk for colon cancer, but it does not have anything to do with her diagnosis of breast cancer.  Recommended that she may want to consider having her husband tested for MUTYH mutations, as this could inform the risk for her son to have MAP syndrome.

## 2017-07-14 NOTE — Progress Notes (Addendum)
HPI: Melissa [name] was previously seen in the [place] clinic due to a family of cancer and concerns regarding a hereditary predisposition to cancer. Please refer to our prior cancer genetics clinic note for more information regarding Melissa [name]'s medical, social and family histories, and our assessment and recommendations, at the time. Melissa [name] recent genetic test results were disclosed to her, as were recommendations warranted by these results. These results and recommendations are discussed in more detail below.   FAMILY HISTORY:  We obtained a detailed, 4-generation family history.  Significant diagnoses are listed below: Family History  Problem Relation Age of Onset  . Heart attack Mother 41       d. 80  . Diabetes Mother   . Hypertension Mother   . Prostate cancer Father   . Diabetes Father   . Kidney failure Father   . Hypertension Father   . Lung cancer Paternal Uncle        heavy smoker  . Arthritis Paternal Grandmother   . Osteoporosis Paternal Grandmother   . Diabetes Paternal Grandfather   . Kidney failure Paternal Grandfather   . Cancer Brother        stomach/colon cancer, d. 60; smoker    The patient has one son who is cancer free.  She has a 6 YO sister who is healthy, but has never had a mammogram.  Both parents are deceased.    The patient's mother died of a heart attack at 32.  She had a brother and sister.  The brother died around age 68 from unknown causes.  Both maternal grandparents have died.  The grandfather died in his 42's from a ruptured hernia and the grandmother may have had cancer.  The patient's father died at 67 from kidney failure.  He had prostate cancer at 52.  He had one brother who had an unknown form of cancer.  Both paternal grandparents are deceased.  The grandmother died of non cancer related issues and the grandfather died of kidney failure at 66.  Melissa [name] is unaware of previous family history of genetic  testing for hereditary cancer risks. Patient's ancestors are of [place], [place] and [place] descent. There is no reported Ashkenazi Jewish ancestry. There is no known consanguinity.  GENETIC TEST RESULTS: At the time of Melissa [name]'s visit, we recommended she pursue genetic testing of the common hereditary cancer panel. This test, which included sequencing and deletion/duplication analysis of the genes listed on the test report, was performed at [facility]. Melissa [name] was called today with her genetic test results. 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 A copy of the test report has been scanned into Epic and is located under the Molecular Pathology section of the Results Review tab.    MUTYH Risks: We discussed 1-2% of individuals of [place] descent are carriers of a single MUTYH mutation. There is conflicting data regarding whether a single MUTYH mutation confers a moderate increased risk (up to 2-fold) for colorectal cancer. If an individual inherits two pathogenic mutations, they have a recessive form of hereditary colonic polyposis. We discussed that this result does not explain Melissa [name]'s breast cancer diagnosis and should not be over interpreted.  SCREENING RECOMMENDATIONS: We discussed the implications of a heterozygous MUTYH mutation for Melissa [name], and discussed who else in the  family should have genetic testing. We recommended Melissa [name] follow the most updated medical management guidelines (NCCN Guidelines v1.2018) for heterozygous MUTYH mutations; all of which are outlined below. These can be coordinated by Melissa [name]'s GI doctor or her primary provider.   - Beginning at age 58 or 37 years younger than the earliest diagnosis of colorectal cancer  in a parent, sibling, or child (whichever is earlier): Colonoscopy every 5 years. If there is no family history of colorectal cancer, data are uncertain if specialized screening is warranted.  - These recommendations may change if an individual has polyps, colorectal cancer, inflammatory bowel disease (IBD), or family history of colorectal cancer.  FAMILY MEMBERS: It is important that all of Ms. Caba's relatives (both men and women) know of the presence of this gene mutation. Site-specific genetic testing can sort out who in the family is at risk and who is not.   Melissa [name] son has a 50% chance to have inherited this mutation. However, he is relatively young and this will not be of any consequence to him for several years. We do not test children because there is no risk to them until they are adults. We recommend they have genetic counseling and testing by the time they are in their early 20's.    Melissa [name] sister has a 50% chance to have inherited this mutation. We recommend they have genetic testing for this same mutation, as identifying the presence of this mutation would allow them to also take advantage of risk-reducing measures.    Because MYH-associated polyposis is a recessive condition, we recommend that her husband undergo genetic testing to determine whether her son is at risk for having MAP.  This can be performed at the [facility].  We strongly encouraged Melissa [name] to remain in contact with [place] in cancer genetics on an annual basis so we can update Melissa [name]'s personal and family histories, and inform her of advances in cancer genetics that may be of benefit for the entire family. Melissa [name] knows she is also welcome to call with any questions or concerns, at any time.   Roma Kayser, MS, [facility]  Certified Genetic Counselor  [phone]

## 2017-07-14 NOTE — Telephone Encounter (Signed)
Spoke to pt concerning [place] from 5.22.19. Denies questions or concerns regarding dx or treatment care plan. Encourage pt to call with needs. Received verbal understanding.

## 2017-07-18 ENCOUNTER — Encounter (HOSPITAL_BASED_OUTPATIENT_CLINIC_OR_DEPARTMENT_OTHER): Payer: Self-pay | Admitting: *Deleted

## 2017-07-18 ENCOUNTER — Other Ambulatory Visit: Payer: Self-pay

## 2017-07-22 ENCOUNTER — Ambulatory Visit
Admission: RE | Admit: 2017-07-22 | Discharge: 2017-07-22 | Disposition: A | Discharge status: Home / Self Care | Payer: 59 | Source: Ambulatory Visit | Attending: Surgery | Admitting: Surgery

## 2017-07-22 DIAGNOSIS — Z170 Estrogen receptor positive status [ER+]: Principal | ICD-10-CM

## 2017-07-22 DIAGNOSIS — C50911 Malignant neoplasm of unspecified site of right female breast: Secondary | ICD-10-CM

## 2017-07-22 NOTE — Progress Notes (Signed)
Ensure pre surgery drink given with instructions to complete by 0800 dos, surgical scrub soap given with instructions, pt verbalized understanding.

## 2017-07-24 NOTE — H&P (Signed)
Melissa [name]  Location: [facility] Surgery Patient #: [id number] DOB: Apr 11, 1970 Undefined / Language: Melissa [name] / Race: White Female  History of Present Illness   The patient is a 47 year old female who presents with a complaint of breast cancer.  The PCP is Dr. Aviva Signs  The patient was referred by Dr. Domenick Bookbinder  The pateint is at the [facility] - Oncology is Drs. [name] and Isidore Moos  She comes with her husband, Melissa [name].  The patient went for her routine mammograms. Her last mammogram was about 1 year ago. She felt no mass in her breast. She has had no other breast problem.  Mammograms: Mammogram 06/22/2017 at [facility] showed a 0.7 x 0.6 cm mass at the 2 o'clock position Biopsy: Right breast biopsy at 2 o'clock on 06/29/2017 [phone]) shows IDC, grad 1, ER - 70%, PR - 100%, Ki67 - 5%, Her2 - Negative Family history of breast or ovarian cancer: None On hormone therapy: She is taking BCP for headaches. She'll stop these.  I discussed the options for breast cancer treatment with the patient. The patient is at the [facility], which includes medical oncology and radiation oncology. I discussed the surgical options of lumpectomy vs. mastectomy. If mastectomy, there is the possibility of reconstruction. I discussed the options of lymph node biopsy. The treatment plan depends on the pathologic staging of the tumor and the patient's personal wishes. The risks of surgery include, but are not limited to, bleeding, infection, the need for further surgery, and nerve injury. The patient has been given literature on the treatment of breast cancer.  Plan: 1) Right breast lumpectomy (seed localizaiton) with right axillary sentinel lymph node biopsy, 2) Genetics (She wants to go ahead with surgery), 3) Rad tx, 4) antihormone tx  Past Medical History: 1. Lap chole - 2010 2. C Section 2006  Social History: Married.  Husband Melissa [name]. He works with EP doctors. She does not work. She has one son, 39 yo  Past Surgical History [name], RN; 07/06/2017 7:28 AM) Breast Biopsy  Right. Cesarean Section - 1  Gallbladder Surgery - Laparoscopic   Diagnostic Studies History [name], RN; 07/06/2017 7:28 AM) Mammogram  within last year Pap Smear  1-5 years ago  Medication History [name], RN; 07/06/2017 7:28 AM) Medications Reconciled  Social History [name], RN; 07/06/2017 7:28 AM) Alcohol use  Occasional alcohol use. Caffeine use  Carbonated beverages, Coffee. Tobacco use  Never smoker.  Family History [name], RN; 07/06/2017 7:28 AM) Diabetes Mellitus  Father, Mother. Heart Disease  Mother. Heart disease in female family member before age 31  Hypertension  Father, Mother. Kidney Disease  Father. Prostate Cancer  Father.  Pregnancy / Birth History [name], RN; 07/06/2017 7:28 AM) Age at menarche  55 years. Contraceptive History  Oral contraceptives. Gravida  1 Maternal age  39-35 Para  1 Regular periods     Review of Systems [name] RN; 07/06/2017 7:28 AM) General Not Present- Appetite Loss, Chills, Fatigue, Fever, Night Sweats, Weight Gain and Weight Loss. Skin Not Present- Change in Wart/Mole, Dryness, Hives, Jaundice, New Lesions, Non-Healing Wounds, Rash and Ulcer. HEENT Present- Seasonal Allergies. Not Present- Earache, Hearing Loss, Hoarseness, Nose Bleed, Oral Ulcers, Ringing in the Ears, Sinus Pain, Sore Throat, Visual Disturbances, Wears glasses/contact lenses and Yellow Eyes. Respiratory Not Present- Bloody sputum, Chronic Cough, Difficulty Breathing, Snoring and Wheezing. Breast Not Present- Breast Mass, Breast Pain, Nipple Discharge and Skin Changes. Cardiovascular Not Present- Chest  Pain, Difficulty Breathing Lying Down, Leg Cramps, Palpitations, Rapid Heart Rate, Shortness of Breath and Swelling of  Extremities. Gastrointestinal Not Present- Abdominal Pain, Bloating, Bloody Stool, Change in Bowel Habits, Chronic diarrhea, Constipation, Difficulty Swallowing, Excessive gas, Gets full quickly at meals, Hemorrhoids, Indigestion, Nausea, Rectal Pain and Vomiting. Female Genitourinary Not Present- Frequency, Nocturia, Painful Urination, Pelvic Pain and Urgency. Musculoskeletal Present- Back Pain. Not Present- Joint Pain, Joint Stiffness, Muscle Pain, Muscle Weakness and Swelling of Extremities. Neurological Not Present- Decreased Memory, Fainting, Headaches, Numbness, Seizures, Tingling, Tremor, Trouble walking and Weakness. Psychiatric Not Present- Anxiety, Bipolar, Change in Sleep Pattern, Depression, Fearful and Frequent crying. Endocrine Not Present- Cold Intolerance, Excessive Hunger, Hair Changes, Heat Intolerance, Hot flashes and New Diabetes. Hematology Not Present- Blood Thinners, Easy Bruising, Excessive bleeding, Gland problems, HIV and Persistent Infections.   Physical Exam  General: WN WF alert and generally healthy appearing. Skin: Inspection and palpation of the skin unremarkable.  Eyes: Conjunctivae white, pupils equal. Face, ears, nose, mouth, and throat: Face - normal. Normal ears and nose. Lips and teeth normal.  Neck: Supple. No mass. Trachea midline. No thyroid mass. Lymph Nodes: No supraclavicular or cervical adenopathy. No axillary adenopathy.  Lungs: Normal respiratory effort. Clear to auscultation and symmetric breath sounds. Cardiovascular: Regular rate and rythm. Normal auscultation of the heart. No murmur or rub.  Breast: Right - Bruise at 2 o'clock, almost at sternum. I do not feel a mass.  Left - no mass or nodule  Abdomen: Soft. No mass. Liver and spleen not palpable. No tenderness. No hernia. Normal bowel sounds.  She has the scars of lap chole Rectal: Not done.  Musculoskeletal/extremities: Normal gait. Good strength and ROM in upper and lower  extremities.  Neurologic: Grossly intact to motor and sensory function. Psychiatric: Has normal mood and affect. Judgement and insight appear normal.  Assessment & Plan  1.  MALIGNANT NEOPLASM OF RIGHT BREAST, STAGE 1, ESTROGEN RECEPTOR POSITIVE (C50.911)  Story: Right breast biopsy at 2 o'clock on 06/29/2017 [phone]) shows IDC, grad 1, ER - 70%, PR - 100%, Ki67 - 5%, Her2 - Negative  Oncology - [facility]  Plan:   1) Right breast lumpectomy (seed localizaiton) with right axillary sentinel lymph node biopsy,   2) Genetics (She wants to go ahead with surgery),   3) Rad tx,   4) antihormone tx  2.  History of lap chole - 2010   Alphonsa Overall, MD, [facility] Surgery Pager: [phone] Office phone:  [phone]

## 2017-07-25 ENCOUNTER — Other Ambulatory Visit: Payer: Self-pay

## 2017-07-25 ENCOUNTER — Encounter (HOSPITAL_COMMUNITY)
Admission: RE | Admit: 2017-07-25 | Discharge: 2017-07-25 | Disposition: A | Discharge status: Home / Self Care | Payer: 59 | Source: Ambulatory Visit | Attending: Surgery | Admitting: Surgery

## 2017-07-25 ENCOUNTER — Ambulatory Visit: Payer: Self-pay | Admitting: Surgery

## 2017-07-25 ENCOUNTER — Ambulatory Visit (HOSPITAL_BASED_OUTPATIENT_CLINIC_OR_DEPARTMENT_OTHER)
Admission: RE | Admit: 2017-07-25 | Discharge: 2017-07-25 | Disposition: A | Discharge status: Home / Self Care | Payer: 59 | Source: Ambulatory Visit | Attending: Surgery | Admitting: Surgery

## 2017-07-25 ENCOUNTER — Ambulatory Visit
Admission: RE | Admit: 2017-07-25 | Discharge: 2017-07-25 | Disposition: A | Discharge status: Home / Self Care | Payer: 59 | Source: Ambulatory Visit | Attending: Surgery | Admitting: Surgery

## 2017-07-25 ENCOUNTER — Encounter (HOSPITAL_BASED_OUTPATIENT_CLINIC_OR_DEPARTMENT_OTHER)
Admission: RE | Disposition: A | Discharge status: Home / Self Care | Payer: Self-pay | Source: Ambulatory Visit | Attending: Surgery

## 2017-07-25 ENCOUNTER — Ambulatory Visit (HOSPITAL_BASED_OUTPATIENT_CLINIC_OR_DEPARTMENT_OTHER): Payer: 59 | Admitting: Anesthesiology

## 2017-07-25 ENCOUNTER — Encounter (HOSPITAL_BASED_OUTPATIENT_CLINIC_OR_DEPARTMENT_OTHER): Payer: Self-pay | Admitting: *Deleted

## 2017-07-25 DIAGNOSIS — Z833 Family history of diabetes mellitus: Secondary | ICD-10-CM | POA: Insufficient documentation

## 2017-07-25 DIAGNOSIS — C50911 Malignant neoplasm of unspecified site of right female breast: Secondary | ICD-10-CM | POA: Diagnosis not present

## 2017-07-25 DIAGNOSIS — Z9049 Acquired absence of other specified parts of digestive tract: Secondary | ICD-10-CM | POA: Insufficient documentation

## 2017-07-25 DIAGNOSIS — Z8042 Family history of malignant neoplasm of prostate: Secondary | ICD-10-CM | POA: Diagnosis not present

## 2017-07-25 DIAGNOSIS — Z841 Family history of disorders of kidney and ureter: Secondary | ICD-10-CM | POA: Insufficient documentation

## 2017-07-25 DIAGNOSIS — E669 Obesity, unspecified: Secondary | ICD-10-CM | POA: Insufficient documentation

## 2017-07-25 DIAGNOSIS — Z170 Estrogen receptor positive status [ER+]: Principal | ICD-10-CM

## 2017-07-25 DIAGNOSIS — Z8249 Family history of ischemic heart disease and other diseases of the circulatory system: Secondary | ICD-10-CM | POA: Insufficient documentation

## 2017-07-25 DIAGNOSIS — Z6832 Body mass index (BMI) 32.0-32.9, adult: Secondary | ICD-10-CM | POA: Insufficient documentation

## 2017-07-25 HISTORY — PX: BREAST LUMPECTOMY WITH RADIOACTIVE SEED AND SENTINEL LYMPH NODE BIOPSY: SHX6550

## 2017-07-25 HISTORY — DX: Malignant (primary) neoplasm, unspecified: C80.1

## 2017-07-25 HISTORY — PX: BREAST LUMPECTOMY: SHX2

## 2017-07-25 SURGERY — BREAST LUMPECTOMY WITH RADIOACTIVE SEED AND SENTINEL LYMPH NODE BIOPSY
Anesthesia: General | Site: Breast | Laterality: Right

## 2017-07-25 MED ORDER — TECHNETIUM TC 99M SULFUR COLLOID FILTERED
1.0000 | Freq: Once | INTRAVENOUS | Status: AC | PRN
  Administered 2017-07-25: 1 via INTRADERMAL

## 2017-07-25 MED ORDER — OXYCODONE HCL 5 MG/5ML PO SOLN: 5.0000 mg | Freq: Once | ORAL | Status: DC | PRN

## 2017-07-25 MED ORDER — MEPERIDINE HCL 25 MG/ML IJ SOLN: 6.2500 mg | INTRAMUSCULAR | Status: DC | PRN

## 2017-07-25 MED ORDER — FENTANYL CITRATE (PF) 100 MCG/2ML IJ SOLN
INTRAMUSCULAR | Status: DC | PRN
  Administered 2017-07-25: 25 ug via INTRAVENOUS
  Administered 2017-07-25: 100 ug via INTRAVENOUS
  Administered 2017-07-25: 50 ug via INTRAVENOUS

## 2017-07-25 MED ORDER — ACETAMINOPHEN 500 MG PO TABS
1000.0000 mg | ORAL_TABLET | ORAL | Status: AC
  Administered 2017-07-25: 1000 mg via ORAL

## 2017-07-25 MED ORDER — FENTANYL CITRATE (PF) 100 MCG/2ML IJ SOLN
INTRAMUSCULAR | Status: AC
  Filled 2017-07-25: qty 2

## 2017-07-25 MED ORDER — CEFAZOLIN SODIUM-DEXTROSE 2-4 GM/100ML-% IV SOLN
INTRAVENOUS | Status: AC
  Filled 2017-07-25: qty 100

## 2017-07-25 MED ORDER — BUPIVACAINE-EPINEPHRINE (PF) 0.5% -1:200000 IJ SOLN
INTRAMUSCULAR | Status: DC | PRN
  Administered 2017-07-25: 30 mL

## 2017-07-25 MED ORDER — LACTATED RINGERS IV SOLN
INTRAVENOUS | Status: DC
  Administered 2017-07-25: 14:00:00 via INTRAVENOUS
  Administered 2017-07-25: 10 mL/h via INTRAVENOUS
  Administered 2017-07-25: 14:00:00 via INTRAVENOUS

## 2017-07-25 MED ORDER — FENTANYL CITRATE (PF) 100 MCG/2ML IJ SOLN: 25.0000 ug | INTRAMUSCULAR | Status: DC | PRN

## 2017-07-25 MED ORDER — DEXAMETHASONE SODIUM PHOSPHATE 4 MG/ML IJ SOLN
INTRAMUSCULAR | Status: DC | PRN
  Administered 2017-07-25: 10 mg via INTRAVENOUS

## 2017-07-25 MED ORDER — FENTANYL CITRATE (PF) 100 MCG/2ML IJ SOLN
50.0000 ug | INTRAMUSCULAR | Status: DC | PRN
  Administered 2017-07-25: 50 ug via INTRAVENOUS

## 2017-07-25 MED ORDER — CELECOXIB 200 MG PO CAPS
200.0000 mg | ORAL_CAPSULE | ORAL | Status: AC
  Administered 2017-07-25: 200 mg via ORAL

## 2017-07-25 MED ORDER — SODIUM CHLORIDE 0.9 % IJ SOLN
INTRAVENOUS | Status: DC | PRN
  Administered 2017-07-25: 1 mL via INTRAMUSCULAR

## 2017-07-25 MED ORDER — PROPOFOL 10 MG/ML IV BOLUS
INTRAVENOUS | Status: DC | PRN
  Administered 2017-07-25: 200 mg via INTRAVENOUS

## 2017-07-25 MED ORDER — PROMETHAZINE HCL 25 MG/ML IJ SOLN
INTRAMUSCULAR | Status: AC
  Filled 2017-07-25: qty 1

## 2017-07-25 MED ORDER — OXYCODONE HCL 5 MG PO TABS: 5.0000 mg | ORAL_TABLET | Freq: Once | ORAL | Status: DC | PRN

## 2017-07-25 MED ORDER — CEFAZOLIN SODIUM-DEXTROSE 2-4 GM/100ML-% IV SOLN
2.0000 g | INTRAVENOUS | Status: AC
  Administered 2017-07-25: 2 g via INTRAVENOUS

## 2017-07-25 MED ORDER — SCOPOLAMINE 1 MG/3DAYS TD PT72
1.0000 | MEDICATED_PATCH | Freq: Once | TRANSDERMAL | Status: DC | PRN
  Administered 2017-07-25: 1.5 mg via TRANSDERMAL

## 2017-07-25 MED ORDER — ONDANSETRON HCL 4 MG/2ML IJ SOLN
INTRAMUSCULAR | Status: DC | PRN
  Administered 2017-07-25 (×2): 4 mg via INTRAVENOUS

## 2017-07-25 MED ORDER — PROMETHAZINE HCL 25 MG/ML IJ SOLN
6.2500 mg | INTRAMUSCULAR | Status: DC | PRN
  Administered 2017-07-25: 6.25 mg via INTRAVENOUS

## 2017-07-25 MED ORDER — LIDOCAINE HCL (CARDIAC) PF 100 MG/5ML IV SOSY
PREFILLED_SYRINGE | INTRAVENOUS | Status: DC | PRN
  Administered 2017-07-25: 30 mg via INTRAVENOUS

## 2017-07-25 MED ORDER — HYDROCODONE-ACETAMINOPHEN 5-325 MG PO TABS: 1.0000 | ORAL_TABLET | Freq: Four times a day (QID) | ORAL | 0 refills | Status: DC | PRN

## 2017-07-25 MED ORDER — BUPIVACAINE-EPINEPHRINE (PF) 0.25% -1:200000 IJ SOLN
INTRAMUSCULAR | Status: DC | PRN
  Administered 2017-07-25: 30 mL

## 2017-07-25 MED ORDER — ACETAMINOPHEN 500 MG PO TABS
ORAL_TABLET | ORAL | Status: AC
  Filled 2017-07-25: qty 2

## 2017-07-25 MED ORDER — CHLORHEXIDINE GLUCONATE CLOTH 2 % EX PADS: 6.0000 | MEDICATED_PAD | Freq: Once | CUTANEOUS | Status: DC

## 2017-07-25 MED ORDER — SCOPOLAMINE 1 MG/3DAYS TD PT72
MEDICATED_PATCH | TRANSDERMAL | Status: AC
  Filled 2017-07-25: qty 1

## 2017-07-25 MED ORDER — CELECOXIB 200 MG PO CAPS
ORAL_CAPSULE | ORAL | Status: AC
  Filled 2017-07-25: qty 1

## 2017-07-25 MED ORDER — LACTATED RINGERS IV SOLN: INTRAVENOUS | Status: DC

## 2017-07-25 MED ORDER — MIDAZOLAM HCL 2 MG/2ML IJ SOLN
INTRAMUSCULAR | Status: AC
  Filled 2017-07-25: qty 2

## 2017-07-25 MED ORDER — MIDAZOLAM HCL 2 MG/2ML IJ SOLN
1.0000 mg | INTRAMUSCULAR | Status: DC | PRN
  Administered 2017-07-25: 2 mg via INTRAVENOUS

## 2017-07-25 SURGICAL SUPPLY — 48 items
BINDER BREAST XLRG (GAUZE/BANDAGES/DRESSINGS) ×3 IMPLANT
BLADE SURG 15 STRL LF DISP TIS (BLADE) ×1 IMPLANT
BLADE SURG 15 STRL SS (BLADE) ×2
CANISTER SUCT 1200ML W/VALVE (MISCELLANEOUS) ×3 IMPLANT
CHLORAPREP W/TINT 26ML (MISCELLANEOUS) ×3 IMPLANT
CLIP VESOCCLUDE SM WIDE 6/CT (CLIP) ×3 IMPLANT
COVER BACK TABLE 60X90IN (DRAPES) ×3 IMPLANT
COVER MAYO STAND STRL (DRAPES) ×3 IMPLANT
COVER PROBE W GEL 5X96 (DRAPES) ×3 IMPLANT
DERMABOND ADVANCED (GAUZE/BANDAGES/DRESSINGS) ×2
DERMABOND ADVANCED .7 DNX12 (GAUZE/BANDAGES/DRESSINGS) ×1 IMPLANT
DEVICE DUBIN W/COMP PLATE 8390 (MISCELLANEOUS) ×3 IMPLANT
DRAPE LAPAROSCOPIC ABDOMINAL (DRAPES) ×3 IMPLANT
DRAPE UTILITY XL STRL (DRAPES) ×3 IMPLANT
DRSG PAD ABDOMINAL 8X10 ST (GAUZE/BANDAGES/DRESSINGS) ×3 IMPLANT
ELECT COATED BLADE 2.86 ST (ELECTRODE) ×3 IMPLANT
ELECT REM PT RETURN 9FT ADLT (ELECTROSURGICAL) ×3
ELECTRODE REM PT RTRN 9FT ADLT (ELECTROSURGICAL) ×1 IMPLANT
GAUZE SPONGE 4X4 12PLY STRL (GAUZE/BANDAGES/DRESSINGS) ×3 IMPLANT
GLOVE BIOGEL M STRL SZ7.5 (GLOVE) ×3 IMPLANT
GLOVE BIOGEL PI IND STRL 7.0 (GLOVE) ×2 IMPLANT
GLOVE BIOGEL PI IND STRL 8 (GLOVE) ×1 IMPLANT
GLOVE BIOGEL PI INDICATOR 7.0 (GLOVE) ×4
GLOVE BIOGEL PI INDICATOR 8 (GLOVE) ×2
GLOVE ECLIPSE 6.5 STRL STRAW (GLOVE) ×3 IMPLANT
GLOVE SURG SIGNA 7.5 PF LTX (GLOVE) ×6 IMPLANT
GOWN STRL REUS W/ TWL LRG LVL3 (GOWN DISPOSABLE) ×1 IMPLANT
GOWN STRL REUS W/ TWL XL LVL3 (GOWN DISPOSABLE) ×2 IMPLANT
GOWN STRL REUS W/TWL LRG LVL3 (GOWN DISPOSABLE) ×2
GOWN STRL REUS W/TWL XL LVL3 (GOWN DISPOSABLE) ×4
KIT MARKER MARGIN INK (KITS) ×3 IMPLANT
NDL SAFETY ECLIPSE 18X1.5 (NEEDLE) ×1 IMPLANT
NEEDLE HYPO 18GX1.5 SHARP (NEEDLE) ×2
NEEDLE HYPO 25X1 1.5 SAFETY (NEEDLE) ×6 IMPLANT
NS IRRIG 1000ML POUR BTL (IV SOLUTION) ×3 IMPLANT
PACK BASIN DAY SURGERY FS (CUSTOM PROCEDURE TRAY) ×3 IMPLANT
PENCIL BUTTON HOLSTER BLD 10FT (ELECTRODE) ×3 IMPLANT
SHEET MEDIUM DRAPE 40X70 STRL (DRAPES) ×3 IMPLANT
SLEEVE SCD COMPRESS KNEE MED (MISCELLANEOUS) ×3 IMPLANT
SPONGE LAP 18X18 RF (DISPOSABLE) ×3 IMPLANT
SUT MNCRL AB 4-0 PS2 18 (SUTURE) ×3 IMPLANT
SUT VICRYL 3-0 CR8 SH (SUTURE) ×3 IMPLANT
SYR CONTROL 10ML LL (SYRINGE) ×6 IMPLANT
TOWEL GREEN STERILE FF (TOWEL DISPOSABLE) ×3 IMPLANT
TOWEL OR NON WOVEN STRL DISP B (DISPOSABLE) ×3 IMPLANT
TUBE CONNECTING 20'X1/4 (TUBING) ×1
TUBE CONNECTING 20X1/4 (TUBING) ×2 IMPLANT
YANKAUER SUCT BULB TIP NO VENT (SUCTIONS) ×3 IMPLANT

## 2017-07-25 NOTE — Anesthesia Procedure Notes (Signed)
Anesthesia Regional Block: Pectoralis block   Pre-Anesthetic Checklist: ,, timeout performed, Correct Patient, Correct Site, Correct Laterality, Correct Procedure, Correct Position, site marked, Risks and benefits discussed,  Surgical consent,  Pre-op evaluation,  At surgeon's request and post-op pain management  Laterality: Right  Prep: chloraprep       Needles:  Injection technique: Single-shot  Needle Type: Echogenic Needle     Needle Length: 9cm  Needle Gauge: 21     Additional Needles:   Procedures:,,,, ultrasound used (permanent image in chart),,,,  Narrative:  Start time: 07/25/2017 11:20 AM End time: 07/25/2017 11:30 AM Injection made incrementally with aspirations every 5 mL.  Performed by: Personally  Anesthesiologist: Effie Berkshire, MD  Additional Notes: Patient tolerated the procedure well. Local anesthetic introduced in an incremental fashion under minimal resistance after negative aspirations. No paresthesias were elicited. After completion of the procedure, no acute issues were identified and patient continued to be monitored by RN.

## 2017-07-25 NOTE — Discharge Instructions (Signed)
CENTRAL  SURGERY - DISCHARGE INSTRUCTIONS TO PATIENT    Activity:  Driving - May drive in 1 to 3 days, if off pain meds   Lifting - No lifitng more than 15 pounds for 1 week.  The no limit  Wound Care:   Wear binder for 2 days.  After 2 days, you may remove dressing and shower.  Go back to a bra for support.  Diet:  As tolearated  Follow up appointment:  Call Dr. Pollie Friar office [facility]) at [phone] for an appointment in 2 to 3 weeks.  Medications and dosages:  Resume your home medications.  You have a prescription for:  vicodin  Call Dr. Lucia Gaskins or his office  [phone]) if you have:  Temperature greater than 100.4,  Persistent nausea and vomiting,  Severe uncontrolled pain,  Redness, tenderness, or signs of infection (pain, swelling, redness, odor or green/yellow discharge around the site),  Any other questions or concerns you may have after discharge.  In an emergency, call 911 or go to an Emergency Department at a nearby hospital.   Post Anesthesia Home Care Instructions  Activity: Get plenty of rest for the remainder of the day. A responsible individual must stay with you for 24 hours following the procedure.  For the next 24 hours, DO NOT: -Drive a car -Paediatric nurse -Drink alcoholic beverages -Take any medication unless instructed by your physician -Make any legal decisions or sign important papers.  Meals: Start with liquid foods such as gelatin or soup. Progress to regular foods as tolerated. Avoid greasy, spicy, heavy foods. If nausea and/or vomiting occur, drink only clear liquids until the nausea and/or vomiting subsides. Call your physician if vomiting continues.  Special Instructions/Symptoms: Your throat may feel dry or sore from the anesthesia or the breathing tube placed in your throat during surgery. If this causes discomfort, gargle with warm salt water. The discomfort should disappear within 24 hours.  If you  had a scopolamine patch placed behind your ear for the management of post- operative nausea and/or vomiting:  1. The medication in the patch is effective for 72 hours, after which it should be removed.  Wrap patch in a tissue and discard in the trash. Wash hands thoroughly with soap and water. 2. You may remove the patch earlier than 72 hours if you experience unpleasant side effects which may include dry mouth, dizziness or visual disturbances. 3. Avoid touching the patch. Wash your hands with soap and water after contact with the patch.

## 2017-07-25 NOTE — Anesthesia Procedure Notes (Signed)
Procedure Name: LMA Insertion Date/Time: 07/25/2017 12:12 PM Performed by: Marrianne Mood, CRNA Pre-anesthesia Checklist: Patient identified, Emergency Drugs available, Suction available, Patient being monitored and Timeout performed Patient Re-evaluated:Patient Re-evaluated prior to induction Oxygen Delivery Method: Circle system utilized Preoxygenation: Pre-oxygenation with 100% oxygen Induction Type: IV induction Ventilation: Mask ventilation without difficulty LMA: LMA inserted LMA Size: 4.0 Number of attempts: 1 Airway Equipment and Method: Bite block Placement Confirmation: positive ETCO2 Tube secured with: Tape Dental Injury: Teeth and Oropharynx as per pre-operative assessment

## 2017-07-25 NOTE — Anesthesia Postprocedure Evaluation (Signed)
Anesthesia Post Note  Patient: Melissa [name]  Procedure(s) Performed: RIGHT BREAST LUMPECTOMY WITH RADIOACTIVE SEED AND SENTINEL LYMPH NODE BIOPSY (Right Breast)     Patient location during evaluation: PACU Anesthesia Type: General Level of consciousness: awake and alert Pain management: pain level controlled Vital Signs Assessment: post-procedure vital signs reviewed and stable Respiratory status: spontaneous breathing, nonlabored ventilation, respiratory function stable and patient connected to nasal cannula oxygen Cardiovascular status: blood pressure returned to baseline and stable Postop Assessment: no apparent nausea or vomiting Anesthetic complications: no    Last Vitals:  Vitals:   07/25/17 1500 07/25/17 1515  BP: 116/80 115/80  Pulse: 82 80  Resp: 18 18  Temp:    SpO2: 100% 96%    Last Pain:  Vitals:   07/25/17 1515  TempSrc:   PainSc: 0-No pain                 Effie Berkshire

## 2017-07-25 NOTE — Transfer of Care (Signed)
Immediate Anesthesia Transfer of Care Note  Patient: Melissa [name]  Procedure(s) Performed: RIGHT BREAST LUMPECTOMY WITH RADIOACTIVE SEED AND SENTINEL LYMPH NODE BIOPSY (Right Breast)  Patient Location: PACU  Anesthesia Type:General  Level of Consciousness: sedated  Airway & Oxygen Therapy: Patient Spontanous Breathing and Patient connected to face mask oxygen  Post-op Assessment: Report given to RN and Post -op Vital signs reviewed and stable  Post vital signs: Reviewed and stable  Last Vitals:  Vitals Value Taken Time  BP 130/86 07/25/2017  1:55 PM  Temp    Pulse 98 07/25/2017  1:56 PM  Resp 15 07/25/2017  1:56 PM  SpO2 100 % 07/25/2017  1:56 PM  Vitals shown include unvalidated device data.  Last Pain:  Vitals:   07/25/17 1023  TempSrc: Oral         Complications: No apparent anesthesia complications

## 2017-07-25 NOTE — Interval H&P Note (Signed)
History and Physical Interval Note:  07/25/2017 11:58 AM  Melissa [name]  has presented today for surgery, with the diagnosis of RIGHT BREAST CANCER  The various methods of treatment have been discussed with the patient and family.  Husband at bedside.  After consideration of risks, benefits and other options for treatment, the patient has consented to  Procedure(s): RIGHT BREAST LUMPECTOMY WITH RADIOACTIVE SEED AND SENTINEL LYMPH NODE BIOPSY (Right) as a surgical intervention .  The patient's history has been reviewed, patient examined, no change in status, stable for surgery.  I have reviewed the patient's chart and labs.  Questions were answered to the patient's satisfaction.     Shann Medal

## 2017-07-25 NOTE — Progress Notes (Signed)
Assisted Dr. Smith Robert with right, ultrasound guided, pectoralis block. Side rails up, monitors on throughout procedure. See vital signs in flow sheet. Tolerated Procedure well.

## 2017-07-25 NOTE — Anesthesia Preprocedure Evaluation (Addendum)
Anesthesia Evaluation  Patient identified by MRN, date of birth, ID band Patient awake    Reviewed: Allergy & Precautions, NPO status , Patient's Chart, lab work & pertinent test results  Airway Mallampati: III  TM Distance: >3 FB Neck ROM: Full    Dental  (+) Teeth Intact, Dental Advisory Given   Pulmonary neg pulmonary ROS,    breath sounds clear to auscultation       Cardiovascular negative cardio ROS   Rhythm:Regular Rate:Normal     Neuro/Psych negative neurological ROS     GI/Hepatic negative GI ROS, Neg liver ROS,   Endo/Other  negative endocrine ROS  Renal/GU negative Renal ROS     Musculoskeletal negative musculoskeletal ROS (+)   Abdominal (+) + obese,   Peds  Hematology negative hematology ROS (+)   Anesthesia Other Findings - HLD  Reproductive/Obstetrics                            Anesthesia Physical Anesthesia Plan  ASA: II  Anesthesia Plan: General   Post-op Pain Management: GA combined w/ Regional for post-op pain   Induction: Intravenous  PONV Risk Score and Plan: 4 or greater and Ondansetron, Dexamethasone, Midazolam and Scopolamine patch - Pre-op  Airway Management Planned: LMA  Additional Equipment: None  Intra-op Plan:   Post-operative Plan: Extubation in OR  Informed Consent: I have reviewed the patients History and Physical, chart, labs and discussed the procedure including the risks, benefits and alternatives for the proposed anesthesia with the patient or authorized representative who has indicated his/her understanding and acceptance.   Dental advisory given  Plan Discussed with: CRNA  Anesthesia Plan Comments:        Anesthesia Quick Evaluation

## 2017-07-25 NOTE — Op Note (Signed)
07/25/2017  1:58 PM  PATIENT:  Melissa [name] DOB: [birth year] MRN: [id number]  PREOP DIAGNOSIS:   RIGHT BREAST CANCER  POSTOP DIAGNOSIS:    Right breast cancer, 2 o'clock position (T1, N0)  PROCEDURE:   Procedure(s): RIGHT BREAST LUMPECTOMY WITH RADIOACTIVE SEED AND Right axillary SENTINEL LYMPH NODE BIOPSY, Injection of peri areolar area of breast with methylene blue (1.0 cc), deep sentinel lymph node biopsy  SURGEON:   Alphonsa Overall, M.D.  ANESTHESIA:   general  Anesthesiologist: Effie Berkshire, MD CRNA: Lieutenant Diego, CRNA; Marrianne Mood, CRNA  General  EBL:  minimal  ml  DRAINS:  none   LOCAL MEDICATIONS USED:   Right pectoral block by anesthesia, 30 cc 1/4% marcaine  SPECIMEN:   Right breast lumpectomy (six color paint), right axillary node (counts - 470, no blue, background - 5)  COUNTS CORRECT:  YES  INDICATIONS FOR PROCEDURE:  Melissa [name] is a 47 y.o. (DOB: 1970/07/22) white female whose primary care physician is [name], [name], MD and comes for right breast lumpectomy and right axillary sentinel lymph node biopsy.   She was seen at the [facility] with Drs. Philis Nettle for a right breast cancer.   The options for breast cancer treatment have been discussed with the patient. She elected to proceed with lumpectomy and axillary sentinel lymph node.     The indications and potential complications of surgery were explained to the patient. Potential complications include, but are not limited to, bleeding, infection, the need for further surgery, and nerve injury.     She had a I131 seed placed on 07/22/2017 in her right breast at [facility].  The seed is in the 2 o'clock position of the right breast.   They failed to inject her right breast for the sentinel lymph node in the the holding area.  Therefore, once she was asleep,  her right areola was injected with 1 millicurie of Technitium Sulfur Colloid.  OPERATIVE NOTE:    The patient was taken to operating room # 8 at [facility] where she underwent a general anesthesia  supervised by Anesthesiologist: Effie Berkshire, MD CRNA: Lieutenant Diego, CRNA; Marrianne Mood, CRNA. Her right breast and axilla were prepped with  ChloraPrep and sterilely draped.    A time-out and the surgical check list was reviewed.    Her right nipple was injected with technitium in a delayed basis.  I injected about 0.5 mL of 40% methylene blue around her right areola.   I turned attention to the cancer which was about at the 2 o'clock position, 8 cm off the areola of the right breast.   I used the Neoprobe to identify the I131 seed.   Because the tumor was so medial, I went directly over the tumor with my incision, instead ofusing a circumareolar incision.   I tried to excise an area around the tumor of at least 1 cm.    I excised this block of breast tissue approximately 3 cm by 4 cm  in diameter.  I took the dissection to the chest wall.    I painted the lumpectomy specimen with the 6 color paint kit and did a specimen mammogram which confirmed the mass, clip, and the seed were all in the right position in the specimen.  The specimen was sent to pathology who called back to confirm that they have the seed and the specimen.   I then started the right deep  axillary sentinel lymph node biopsy. I made an incision in the right axilla.  I found a hot area at the junction of the breast and the pectoralis major muscle, deep in the axilla. I cut down and  identified a hot node that had counts of 470 and the background has 5 counts. The lymph node was not blue. I checked her internal mammary nodes and supraclavicular nodes with the neoprobe and found no other hot area. The axillary node was then sent to pathology.    I then irrigated the wound with saline. I infiltrated approximately 30 mL of 1/4% Marcaine between the incisions. I placed 4 clips to mark biopsy cavity, at 12, 3, 6, and 9  o'clock.  I then closed all the wounds in layers using 3-0 Vicryl sutures for the deep layer. At the skin, I closed the incisions with a 4-0 Monocryl suture. The incisions were then painted with Dermabond.  She had gauze place over the wounds and placed in a breast binder.   The patient tolerated the procedure well, was transported to the recovery room in good condition. Sponge and needle count were correct at the end of the case.   Final pathology is pending.   Alphonsa Overall, MD, [facility] Pager: [phone] Office phone:  [phone]

## 2017-07-26 ENCOUNTER — Encounter (HOSPITAL_BASED_OUTPATIENT_CLINIC_OR_DEPARTMENT_OTHER): Payer: Self-pay | Admitting: Surgery

## 2017-07-26 NOTE — Progress Notes (Signed)
error 

## 2017-08-02 ENCOUNTER — Inpatient Hospital Stay: Payer: 59 | Attending: Hematology and Oncology | Admitting: Hematology and Oncology

## 2017-08-02 ENCOUNTER — Telehealth: Payer: Self-pay | Admitting: *Deleted

## 2017-08-02 DIAGNOSIS — C50211 Malignant neoplasm of upper-inner quadrant of right female breast: Secondary | ICD-10-CM

## 2017-08-02 DIAGNOSIS — Z170 Estrogen receptor positive status [ER+]: Secondary | ICD-10-CM | POA: Insufficient documentation

## 2017-08-02 NOTE — Telephone Encounter (Signed)
Received order for oncotype testing. Requisition faxed to pathology. Received by Keisha 

## 2017-08-02 NOTE — Progress Notes (Signed)
Patient Care Team: [name], [name], MD as PCP - General (Family Medicine) Alphonsa Overall, MD as Consulting Physician (General Surgery) [name], MD as Consulting Physician (Hematology and Oncology) Eppie Gibson, MD as Attending Physician (Radiation Oncology)  DIAGNOSIS:  Encounter Diagnosis  Name Primary?  . Malignant neoplasm of upper-inner quadrant of right breast in female, estrogen receptor positive ([place])     SUMMARY OF ONCOLOGIC HISTORY:   Malignant neoplasm of upper-inner quadrant of right breast in female, estrogen receptor positive ([place])   06/29/2017 Initial Diagnosis    Screening detected right breast mass at 2 o'clock position 0.7 cm, axillary ultrasound negative.  Biopsy revealed IDC grade 1 ER 90%, PR 100%, Ki-67 5%, HER-2 negative ratio 1.19, T1 BN 0 stage I a clinical stage AJCC 8      07/13/2017 Genetic Testing    Negative genetic testing on the 9 gene STAT panel.  The STAT Breast cancer panel offered by [facility] includes sequencing and rearrangement analysis for the following 9 genes:  ATM, BRCA1, BRCA2, CDH1, CHEK2, PALB2, PTEN, STK11 and TP53.   The report date is Jul 13, 2017.  The Multi-Gene Panel offered by [facility] includes sequencing and/or deletion duplication testing of the following 83 genes: ALK, APC, ATM, AXIN2,BAP1,  BARD1, BLM, BMPR1A, BRCA1, BRCA2, BRIP1, CASR, CDC73, CDH1, CDK4, CDKN1B, CDKN1C, CDKN2A (p14ARF), CDKN2A (p16INK4a), CEBPA, CHEK2, CTNNA1, DICER1, DIS3L2, EGFR (c.2369C>T, p.Thr790Met variant only), EPCAM (Deletion/duplication testing only), FH, FLCN, GATA2, GPC3, GREM1 (Promoter region deletion/duplication testing only), HOXB13 (c.251G>A, p.Gly84Glu), HRAS, KIT, MAX, MEN1, MET, MITF (c.952G>A, p.Glu318Lys variant only), MLH1, MSH2, MSH3, MSH6, MUTYH, NBN, NF1, NF2, NTHL1, PALB2, PDGFRA, PHOX2B, PMS2, POLD1, POLE, POT1, PRKAR1A, PTCH1, PTEN, RAD50, RAD51C, RAD51D, RB1, RECQL4, RET, RUNX1, SDHAF2, SDHA (sequence changes only), SDHB, SDHC, SDHD,  SMAD4, SMARCA4, SMARCB1, SMARCE1, STK11, SUFU, TERT, TERT, TMEM127, TP53, TSC1, TSC2, VHL, WRN and WT1.   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  The report date is Jul 13, 2017.      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  She is healing very well from recent surgery.  She does have some discomfort under the arm but it appears to be getting better with time.  REVIEW OF SYSTEMS:   Constitutional: Denies fevers, chills or abnormal weight loss Eyes: Denies blurriness of vision Ears, nose, mouth, throat, and face: Denies mucositis or sore throat Respiratory: Denies cough, dyspnea or wheezes Cardiovascular: Denies palpitation, chest discomfort Gastrointestinal:  Denies nausea, heartburn or change in bowel habits Skin: Denies abnormal skin rashes Lymphatics: Denies new lymphadenopathy or easy bruising Neurological:Denies numbness, tingling or new weaknesses Behavioral/Psych: Mood is stable, no new changes  Extremities: No lower extremity edema Breast: Discomfort in the axilla All other systems were reviewed with the patient and are negative.  I have reviewed the past medical history, past surgical history, social history and family history with the patient and they are unchanged from previous note.  ALLERGIES:  has No Known Allergies.  MEDICATIONS:  Current Outpatient Medications  Medication Sig Dispense Refill  . acetaminophen (TYLENOL) 500 MG  tablet Take 500 mg by mouth every 6 (six) hours as needed.    .  fexofenadine (ALLEGRA) 180 MG tablet Take 180 mg by mouth daily as needed for allergies or rhinitis.    . naproxen sodium (ALEVE) 220 MG tablet Take 220 mg by mouth as needed.     No current facility-administered medications for this visit.     PHYSICAL EXAMINATION: ECOG PERFORMANCE STATUS: 1 - Symptomatic but completely ambulatory  Vitals:   08/02/17 1355  BP: (!) 135/100  Pulse: 79  Resp: 18  Temp: 98.4 F (36.9 C)  SpO2: 100%   Filed Weights   08/02/17 1355  Weight: 178 lb 4.8 oz (80.9 kg)    GENERAL:alert, no distress and comfortable SKIN: skin color, texture, turgor are normal, no rashes or significant lesions EYES: normal, Conjunctiva are pink and non-injected, sclera clear OROPHARYNX:no exudate, no erythema and lips, buccal mucosa, and tongue normal  NECK: supple, thyroid normal size, non-tender, without nodularity LYMPH:  no palpable lymphadenopathy in the cervical, axillary or inguinal LUNGS: clear to auscultation and percussion with normal breathing effort HEART: regular rate & rhythm and no murmurs and no lower extremity edema ABDOMEN:abdomen soft, non-tender and normal bowel sounds MUSCULOSKELETAL:no cyanosis of digits and no clubbing  NEURO: alert & oriented x 3 with fluent speech, no focal motor/sensory deficits EXTREMITIES: No lower extremity edema BREAST: No palpable masses or nodules in either right or left breasts. No palpable axillary supraclavicular or infraclavicular adenopathy no breast tenderness or nipple discharge. (exam performed in the presence of a chaperone)  LABORATORY DATA:  I have reviewed the data as listed CMP Latest Ref Rng & Units 07/06/2017  Glucose 70 - 140 mg/dL 103  BUN 7 - 26 mg/dL 10  Creatinine 0.60 - 1.10 mg/dL 0.90  Sodium 136 - 145 mmol/L 142  Potassium 3.5 - 5.1 mmol/L 4.4  Chloride 98 - 109 mmol/L 109  CO2 22 - 29 mmol/L 22  Calcium 8.4 - 10.4 mg/dL 8.9  Total Protein 6.4 - 8.3 g/dL 6.8  Total Bilirubin 0.2 - 1.2 mg/dL  0.4  Alkaline Phos 40 - 150 U/L 81  AST 5 - 34 U/L 25  ALT 0 - 55 U/L 25    Lab Results  Component Value Date   WBC 8.0 07/06/2017   HGB 13.3 07/06/2017   HCT 40.4 07/06/2017   MCV 84.9 07/06/2017   PLT 258 07/06/2017   NEUTROABS 5.6 07/06/2017    ASSESSMENT & PLAN:  Malignant neoplasm of upper-inner quadrant of right breast in female, estrogen receptor positive ([place]) 06/29/2017:Screening detected right breast mass at 2 o'clock position 0.7 cm, axillary ultrasound negative.  Biopsy revealed IDC grade 1 ER 90%, PR 100%, Ki-67 5%, HER-2 negative ratio 1.19, T1 BN 0 stage I a clinical stage AJCC 8  07/25/2017: Right lumpectomy: IDC grade 2, 1 cm, margins negative, negative for lymphovascular or perineural invasion, 0/2 lymph nodes negative, ER 70%, PR 100%, HER-2 negative ratio 1.19, Ki-67 5%, T1 be N0 stage Ia  Pathology counseling: I discussed the final pathology report of the patient provided  a copy of this report. I discussed the margins as well as lymph node surgeries. We also discussed the final staging along with previously performed ER/PR and HER-2/neu testing.  Recommendation: 1.  Oncotype DX to determine if she would benefit from systemic chemotherapy.  I recommended doing Oncotype because the final grade is grade 2 and it is 1 cm in size. 2. followed by adjuvant radiation 3.  Followed by adjuvant antiestrogen therapy   Return to clinic based upon Oncotype test results.  No orders of the defined types were placed in this encounter.  The patient has a good understanding of the overall plan. she agrees with it. she will call with any problems that may develop before the next visit here.   Harriette Ohara, MD 08/02/17

## 2017-08-02 NOTE — Assessment & Plan Note (Signed)
06/29/2017:Screening detected right breast mass at 2 o'clock position 0.7 cm, axillary ultrasound negative.  Biopsy revealed IDC grade 1 ER 90%, PR 100%, Ki-67 5%, HER-2 negative ratio 1.19, T1 BN 0 stage I a clinical stage AJCC 8  07/25/2017: Right lumpectomy: IDC grade 2, 1 cm, margins negative, negative for lymphovascular or perineural invasion, 0/2 lymph nodes negative, ER 70%, PR 100%, HER-2 negative ratio 1.19, Ki-67 5%, T1 be N0 stage Ia  Pathology counseling: I discussed the final pathology report of the patient provided  a copy of this report. I discussed the margins as well as lymph node surgeries. We also discussed the final staging along with previously performed ER/PR and HER-2/neu testing.  Recommendation: 1.  Oncotype DX to determine if she would benefit from systemic chemotherapy.  I recommended doing Oncotype because the final grade is grade 2 and it is 1 cm in size. 2. followed by adjuvant radiation 3.  Followed by adjuvant antiestrogen therapy   Return to clinic based upon Oncotype test results.

## 2017-08-09 ENCOUNTER — Ambulatory Visit
Admission: RE | Admit: 2017-08-09 | Discharge: 2017-08-09 | Disposition: A | Discharge status: Home / Self Care | Payer: 59 | Source: Ambulatory Visit | Attending: Radiation Oncology | Admitting: Radiation Oncology

## 2017-08-09 ENCOUNTER — Ambulatory Visit: Payer: 59

## 2017-08-09 DIAGNOSIS — C50211 Malignant neoplasm of upper-inner quadrant of right female breast: Secondary | ICD-10-CM

## 2017-08-09 DIAGNOSIS — Z170 Estrogen receptor positive status [ER+]: Principal | ICD-10-CM

## 2017-08-12 NOTE — Progress Notes (Signed)
Location of Breast Cancer: Right Breast  Histology per Pathology Report:  06/29/17 Diagnosis Breast, right, needle core biopsy, upper inner, 2 o'clock, 8cmfn - INVASIVE DUCTAL CARCINOMA.  Receptor Status: ER(70%), PR (100%), Her2-neu (NEG), Ki-(5%)  07/25/17 Diagnosis 1. Breast, lumpectomy, Right - INVASIVE DUCTAL CARCINOMA, GRADE 2, 1.0 CM. - ALL RESECTION MARGINS ARE NEGATIVE FOR CARCINOMA; CLOSEST IS POSTERIOR MARGIN AT 0.4 CM. - NEGATIVE FOR LYMPHOVASCULAR OR PERINEURAL INVASION. - BIOPSY SITE CHANGES. - SEE ONCOLOGY TABLE. 2. Lymph node, sentinel, biopsy, Right axillary - LYMPH NODE, NEGATIVE FOR CARCINOMA (0/1). 3. Lymph node, sentinel, biopsy, Right axillary - LYMPH NODE, NEGATIVE FOR CARCINOMA (0/1).  Did patient present with symptoms or was this found on screening mammography?: It was found on a screening mammogram.   Past/Anticipated interventions by surgeon, if any: 07/25/17 PROCEDURE:   Procedure(s): RIGHT BREAST LUMPECTOMY WITH RADIOACTIVE SEED AND Right axillary SENTINEL LYMPH NODE BIOPSY, Injection of peri areolar area of breast with methylene blue (1.0 cc), deep sentinel lymph node biopsy SURGEON:   Melissa [name], M.D.  Past/Anticipated interventions by medical oncology, if any: 08/02/17 Dr. Lindi [name] Recommendation: 1.  Oncotype DX to determine if she would benefit from systemic chemotherapy.  I recommended doing Oncotype because the final grade is grade 2 and it is 1 cm in size.  (Oncotype low risk- no chemotherapy) 2. followed by adjuvant radiation 3.  Followed by adjuvant antiestrogen therapy  Return to clinic based upon Oncotype test results  Lymphedema issues, if any:  She denies. She reports numbness to her upper right arm.   Pain issues, if any:  She reports soreness from surgery.   SAFETY ISSUES:  Prior radiation? No  Pacemaker/ICD? No  Possible current pregnancy? No, she is still having periods. She reports that she is currently on her period.    Is the patient on methotrexate? No  Current Complaints / other details:   BP 127/90 (BP Location: Left Arm, Patient Position: Sitting, Cuff Size: Normal)   Pulse 88   Temp 98.2 F (36.8 C) (Oral)   Resp 20   Ht 5' 2.5" (1.588 m)   Wt 183 lb 6.4 oz (83.2 kg)   SpO2 100%   BMI 33.01 kg/m    Wt Readings from Last 3 Encounters:  08/19/17 183 lb 6.4 oz (83.2 kg)  08/02/17 178 lb 4.8 oz (80.9 kg)  07/25/17 179 lb (81.2 kg)      Melissa [name], Melissa Police, RN 08/12/2017,9:17 AM

## 2017-08-15 ENCOUNTER — Telehealth: Payer: Self-pay | Admitting: *Deleted

## 2017-08-15 NOTE — Telephone Encounter (Signed)
Received oncotype score of 16/4%. Physician team notified Called pt to discuss results. Informed she does not need chemotherapy and that her next step is xrt. Confirmed appt with Dr. Isidore Moos on 08/19/17

## 2017-08-16 ENCOUNTER — Encounter (HOSPITAL_COMMUNITY): Payer: Self-pay | Admitting: Hematology and Oncology

## 2017-08-19 ENCOUNTER — Encounter: Payer: Self-pay | Admitting: Radiation Oncology

## 2017-08-19 ENCOUNTER — Ambulatory Visit
Admission: RE | Admit: 2017-08-19 | Discharge: 2017-08-19 | Disposition: A | Discharge status: Home / Self Care | Payer: 59 | Source: Ambulatory Visit | Attending: Radiation Oncology | Admitting: Radiation Oncology

## 2017-08-19 ENCOUNTER — Other Ambulatory Visit: Payer: Self-pay

## 2017-08-19 VITALS — BP 127/90 | HR 88 | Temp 98.2°F | Resp 20 | Ht 62.5 in | Wt 183.4 lb

## 2017-08-19 DIAGNOSIS — C50211 Malignant neoplasm of upper-inner quadrant of right female breast: Secondary | ICD-10-CM | POA: Diagnosis not present

## 2017-08-19 DIAGNOSIS — Z170 Estrogen receptor positive status [ER+]: Secondary | ICD-10-CM | POA: Diagnosis present

## 2017-08-19 DIAGNOSIS — Z510 Encounter for antineoplastic radiation therapy: Secondary | ICD-10-CM | POA: Insufficient documentation

## 2017-08-19 LAB — PREGNANCY, URINE: Preg Test, Ur: NEGATIVE

## 2017-08-19 NOTE — Progress Notes (Signed)
  Radiation Oncology         [phone]) [phone] ________________________________  Name: Melissa [name] MRN: [id number]  Date: 08/19/2017  DOB: [birth year]  SIMULATION AND TREATMENT PLANNING NOTE    outpatient  DIAGNOSIS:     ICD-10-CM   1. Malignant neoplasm of upper-inner quadrant of right breast in female, estrogen receptor positive ([place]) C50.211    Z17.0     NARRATIVE:  The patient was brought to the [facility].  Identity was confirmed.  All relevant records and images related to the planned course of therapy were reviewed.  The patient freely provided informed written consent to proceed with treatment after reviewing the details related to the planned course of therapy. The consent form was witnessed and verified by the simulation staff.    Then, the patient was set-up in a stable reproducible supine position for radiation therapy with her ipsilateral arm over her head, and her upper body secured in a custom-made Vac-lok device.  CT images were obtained.  Surface markings were placed.  The CT images were loaded into the planning software.    TREATMENT PLANNING NOTE: Treatment planning then occurred.  The radiation prescription was entered and confirmed.     A total of 3 medically necessary complex treatment devices were fabricated and supervised by me: 2 fields with MLCs for custom blocks to protect heart, and lungs;  and, a Vac-lok. MORE COMPLEX DEVICES MAY BE MADE IN DOSIMETRY FOR FIELD IN FIELD BEAMS FOR DOSE HOMOGENEITY.  I have requested : 3D Simulation which is medically necessary to give adequate dose to at risk tissues while sparing lungs and heart.  I have requested a DVH of the following structures: lungs, heart, lumpectomy cavity.    The patient will receive 40.05 Gy in 15 fractions to the right breast with 2 tangential fields.  This will be followed by a boost.  Optical Surface Tracking Plan:  Since intensity modulated radiotherapy (IMRT) and 3D conformal  radiation treatment methods are predicated on accurate and precise positioning for treatment, intrafraction motion monitoring is medically necessary to ensure accurate and safe treatment delivery. The ability to quantify intrafraction motion without excessive ionizing radiation dose can only be performed with optical surface tracking. Accordingly, surface imaging offers the opportunity to obtain 3D measurements of patient position throughout IMRT and 3D treatments without excessive radiation exposure. I am ordering optical surface tracking for this patient's upcoming course of radiotherapy.  ________________________________   Reference:  Ursula Alert, J, et al. Surface imaging-based analysis of intrafraction motion for breast radiotherapy patients.Journal of [place], n. 6, nov. 2014. ISSN [id number].  Available at: <http://www.jacmp.org/index.php/jacmp/article/view/4957>.    -----------------------------------  Eppie Gibson, MD

## 2017-08-19 NOTE — Progress Notes (Addendum)
Radiation Oncology         [phone] ________________________________  Name: Melissa [name] MRN: [id number]  Date: 08/19/2017  DOB: March 17, 1970  Follow-Up Visit Note  Outpatient  CC: [name], [name], MD  [name], MD  Diagnosis:      ICD-10-CM   1. Malignant neoplasm of upper-inner quadrant of right breast in female, estrogen receptor positive ([place]) [place] Ambulatory referral to Social Work   Z17.0 Pregnancy, urine    Ambulatory referral to Social Work    Stage IA, cT1bN0M0 Right Breast UIQ Invasive Ductal Carcinoma, ER(+) / PR(+) / Her2(-), Grade 1 Cancer Staging Malignant neoplasm of upper-inner quadrant of right breast in female, estrogen receptor positive ([place]) Staging form: Breast, AJCC 8th Edition - Clinical stage from 07/06/2017: Stage IA (cT1b, cN0, cM0, G1, ER+, PR+, HER2-) - Unsigned - Pathologic: Stage IA (pT1b, pN0, cM0, G2, ER+, PR+, HER2-) - Unsigned   CHIEF COMPLAINT: Here to discuss management of right breast cancer  Narrative:  The patient returns today for follow-up. She presents to the office today with her husband. She is doing well overall.    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  Breast or nodal biopsies, since consultation on 07/25/2017, involved: Breast, lumpectomy, Right with invasive ductal carcinoma, grade 2, 1.0 cm. All resection margins are negative for carcinoma; closest is posterior margin at 0.4 cm. Negative for lymphovascular or perineural invasion. Biopsy site changes. Of the two lymph nodes biopsied through sentinel node biopsy all were negative for carcinoma (0/2). Prognostic indicators significant for: ER, 70% positive with moderate staining intensity and PR, 100% positive with strong staining intensity. Proliferation marker Ki67 at 5%. HER2 negative.  The Oncotype DX score on 07/25/2017 was 16 predicting a risk of outside the breast recurrence over the next 9 years of 4% if the patient's  only systemic therapy is tamoxifen for 5 years. It also predicts no benefit from chemotherapy.  She has met with her Medical Oncologist, Dr. [name] recently and will undergo adjuvant antiestrogen therapy following possible radiation.   Symptomatically, she denies any other symptoms. Pertinent positives are listed and detailed within the above HPI.  She notes that she was planning on going on vacation August 19-23 this year with her family. She has been sexually active, however, she has been using barrier contraceptions (condoms) with her husband.          ALLERGIES:  has No Known Allergies.  Meds: Current Outpatient Medications  Medication Sig Dispense Refill  . naproxen sodium (ALEVE) 220 MG tablet Take 220 mg by mouth as needed.    Marland Kitchen acetaminophen (TYLENOL) 500 MG tablet Take 500 mg by mouth every 6 (six) hours as needed.    . fexofenadine (ALLEGRA) 180 MG tablet Take 180 mg by mouth daily as needed for allergies or rhinitis.     No current facility-administered medications for this encounter.     Physical Findings:  height is 5' 2.5" (1.588 m) and weight is 183 lb 6.4 oz (83.2 kg). Her oral temperature is 98.2 F (36.8 C). Her blood pressure is 127/90 and her pulse is 88. Her respiration is 20 and oxygen saturation is 100%. .     General: Alert and oriented, in no acute distress Heart: Regular in rate and rhythm with no murmurs, rubs, or gallops. Chest: Clear to auscultation bilaterally, with no rhonchi, wheezes, or rales. Psychiatric: Judgment and insight are intact. Affect is appropriate. Breast exam reveals upper inner right lumpectomy scar that  is healing well with a modest seroma underneath. Right axillary scar healing well.  Lab Findings: Lab Results  Component Value Date   WBC 8.0 07/06/2017   HGB 13.3 07/06/2017   HCT 40.4 07/06/2017   MCV 84.9 07/06/2017   PLT 258 07/06/2017    Radiographic Findings: Nm Sentinel Node Inj-no Rpt (breast)  Result Date:  07/25/2017 Sulfur colloid was injected by the nuclear medicine technologist for melanoma sentinel node.   Mm Breast Surgical Specimen  Result Date: 07/25/2017 CLINICAL DATA:  RIGHT lumpectomy for grade 1 invasive ductal carcinoma following radioactive seed localization. EXAM: SPECIMEN RADIOGRAPH OF THE RIGHT BREAST COMPARISON:  07/22/2017 and earlier FINDINGS: Status post excision of the right breast. The radioactive seed and biopsy marker clip are present, completely intact, and were marked for pathology. IMPRESSION: Specimen radiograph of the right breast. Electronically Signed   By: Nolon Nations M.D.   On: 07/25/2017 13:08   Mm Rt Radioactive Seed Loc Mammo Guide  Result Date: 07/22/2017 CLINICAL DATA:  Patient with recent diagnosis right breast carcinoma, for preoperative localization. EXAM: MAMMOGRAPHIC GUIDED RADIOACTIVE SEED LOCALIZATION OF THE RIGHT BREAST COMPARISON:  Previous exam(s). FINDINGS: Patient presents for radioactive seed localization prior to right breast lumpectomy. I met with the patient and we discussed the procedure of seed localization including benefits and alternatives. We discussed the high likelihood of a successful procedure. We discussed the risks of the procedure including infection, bleeding, tissue injury and further surgery. We discussed the low dose of radioactivity involved in the procedure. Informed, written consent was given. The usual time-out protocol was performed immediately prior to the procedure. Using mammographic guidance, sterile technique, 1% lidocaine and an I-125 radioactive seed, mass within the medial right breast was localized using a medial approach. The follow-up mammogram images confirm the seed in the expected location and were marked for Dr. Lucia Gaskins. Follow-up survey of the patient confirms presence of the radioactive seed. Order number of I-125 seed:  [id number]. Total activity:  1.540 millicuries reference Date: 06/21/2017 The patient tolerated  the procedure well and was released from the [facility]. She was given instructions regarding seed removal. IMPRESSION: Radioactive seed localization right breast. No apparent complications. Electronically Signed   By: Lovey Newcomer M.D.   On: 07/22/2017 13:34    Impression/Plan: We discussed adjuvant radiotherapy today. I recommend 4 weeks of radiation to right breast in order to reduce her risks of locoregional recurrence by 1/2. The risks, benefits and side effects of this treatment were discussed in detail.  She understands that radiotherapy is associated with skin irritation and fatigue in the acute setting. Late effects can include cosmetic changes and rare injury to internal organs. She is enthusiastic about proceeding with treatment. A consent form has been signed and placed in her chart.  A total of 3 medically necessary complex treatment devices will be fabricated and supervised by me: 2 fields with MLCs for custom blocks to protect heart, and lungs;  and, a Vac-lok. MORE COMPLEX DEVICES MAY BE MADE IN DOSIMETRY FOR FIELD IN FIELD BEAMS FOR DOSE HOMOGENEITY.  I have requested : 3D Simulation which is medically necessary to give adequate dose to at risk tissues while sparing lungs and heart.  I have requested a DVH of the following structures: lungs, heart, right lumpectomy cavity.    The patient will receive 40.05 Gy in 15 fractions to the right breast with 2 fields.  This will be followed by a boost.  Will obtain a UA sample today to  r/o pregnancy. Advised the patient to continue use of barrier contraceptions during radiation treatments. CT simulation appointment today at 2:45 PM with plan to start radiation treatments shortly after.  --> Pregnancy test negative today  I spent 15 minutes face to face with the patient and more than 50% of that time was spent in counseling and/or coordination of care. _____________________________________   Eppie Gibson, MD  This document serves as a  record of services personally performed by Eppie Gibson, MD. It was created on her behalf by Steva Colder, a trained medical scribe. The creation of this record is based on the scribe's personal observations and the provider's statements to them. This document has been checked and approved by the attending provider.

## 2017-08-22 ENCOUNTER — Encounter: Payer: Self-pay | Admitting: *Deleted

## 2017-08-22 NOTE — Progress Notes (Signed)
[facility] Psychosocial Distress Screening Clinical Social Work  Clinical Social Work was referred by distress screening protocol.  The patient scored a 5 on the Psychosocial Distress Thermometer which indicates moderate distress. Clinical Social Worker contacted patient by phone to assess for distress and other psychosocial needs.  CSW left patient a voicemail offering support and information on the support team and support programs at [facility].  CSW encouraged patient to call back with questions or concerns.      ONCBCN DISTRESS SCREENING 08/19/2017  Screening Type Initial Screening  Distress experienced in past week (1-10) 5  Practical problem type Childcare  Emotional problem type Nervousness/Anxiety;Adjusting to appearance changes  Information Concerns Type Lack of info about treatment  Physician notified of physical symptoms    Johnnye Lana, MSW, LCSW, OSW-C Clinical Social Worker [place] [phone]

## 2017-08-25 ENCOUNTER — Telehealth: Payer: Self-pay | Admitting: Hematology and Oncology

## 2017-08-25 ENCOUNTER — Encounter: Payer: Self-pay | Admitting: Physical Therapy

## 2017-08-25 ENCOUNTER — Other Ambulatory Visit: Payer: Self-pay

## 2017-08-25 ENCOUNTER — Ambulatory Visit: Payer: 59 | Attending: Surgery | Admitting: Physical Therapy

## 2017-08-25 DIAGNOSIS — Z170 Estrogen receptor positive status [ER+]: Secondary | ICD-10-CM | POA: Diagnosis present

## 2017-08-25 DIAGNOSIS — Z483 Aftercare following surgery for neoplasm: Secondary | ICD-10-CM | POA: Diagnosis present

## 2017-08-25 DIAGNOSIS — M25611 Stiffness of right shoulder, not elsewhere classified: Secondary | ICD-10-CM | POA: Diagnosis present

## 2017-08-25 DIAGNOSIS — C50211 Malignant neoplasm of upper-inner quadrant of right female breast: Secondary | ICD-10-CM | POA: Diagnosis not present

## 2017-08-25 DIAGNOSIS — R293 Abnormal posture: Secondary | ICD-10-CM | POA: Diagnosis present

## 2017-08-25 NOTE — Telephone Encounter (Signed)
Mailed patient calendar of upcoming august appts per 7/9 sch message

## 2017-08-25 NOTE — Therapy (Signed)
[facility] [place], [place], [address] Phone: [phone]   Fax:  [phone]  Physical Therapy Treatment  Patient Details  Name: Melissa [name] MRN: [id number] Date of Birth: [birth year] Referring Provider: Dr. Alphonsa Overall   Encounter Date: 08/25/2017  PT End of Session - 08/25/17 1132    Visit Number  2    Number of Visits  10    Date for PT Re-Evaluation  09/22/17    PT Start Time  1100    PT Stop Time  1145    PT Time Calculation (min)  45 min    Activity Tolerance  Patient tolerated treatment well    Behavior During Therapy  [facility] for tasks assessed/performed       Past Medical History:  Diagnosis Date  . Cancer ([place]) 06/2017   right breast cancer  . Family history of prostate cancer     Past Surgical History:  Procedure Laterality Date  . BREAST LUMPECTOMY WITH RADIOACTIVE SEED AND SENTINEL LYMPH NODE BIOPSY Right 07/25/2017   Procedure: RIGHT BREAST LUMPECTOMY WITH RADIOACTIVE SEED AND SENTINEL LYMPH NODE BIOPSY;  Surgeon: Alphonsa Overall, MD;  Location: [facility];  Service: General;  Laterality: Right;  . CESAREAN SECTION    . CHOLECYSTECTOMY      There were no vitals filed for this visit.  Subjective Assessment - 08/25/17 1101    Subjective  Patient underwent a right lumpectomy and sentinel node biopsy (0/2 nodes) on 07/25/17. No chemo needed but will begin radiation 08/29/17. She will then undergo anti-estrogen therapy.    Pertinent History  Patient underwent a right lumpectomy and sentinel node biopsy (0/2 nodes) on 07/25/17.    Patient Stated Goals  Make sure my arm is ok    Currently in Pain?  Yes    Pain Score  2     Pain Location  Axilla    Pain Orientation  Right    Pain Descriptors / Indicators  Aching;Tender    Pain Type  Surgical pain    Pain Onset  1 to 4 weeks ago    Pain Frequency  Intermittent    Aggravating Factors   Unknown    Pain Relieving Factors  Putting  pillow under arm for sleeping    Multiple Pain Sites  No         [facility] PT Assessment - 08/25/17 0001      Assessment   Medical Diagnosis  s/p right shoulder lumpectomy and SLNB    Referring Provider  Dr. Alphonsa Overall    Onset Date/Surgical Date  07/25/17    Hand Dominance  Right    Prior Therapy  Baseline      Precautions   Precautions  Other (comment)    Precaution Comments  right arm lymphedema risk      Restrictions   Weight Bearing Restrictions  No      Balance Screen   Has the patient fallen in the past 6 months  No    Has the patient had a decrease in activity level because of a fear of falling?   No    Is the patient reluctant to leave their home because of a fear of falling?   No      Home Environment   Living Environment  Private residence    Living Arrangements  Spouse/significant other;Children Husband and 29 y.o. son    Available Help at Discharge  Family      Prior Function  Level of Independence  Independent    Vocation  Unemployed    Leisure  Has not yet returned to tennis but is walking daily      Cognition   Overall Cognitive Status  Within Functional Limits for tasks assessed      Observation/Other Assessments   Observations   Thick area present just superior to axillary incision site which may be a seroma. Breast incision is thick and bound down with scar tissue present inferior to incision.      Posture/Postural Control   Posture/Postural Control  Postural limitations    Postural Limitations  Rounded Shoulders;Forward head      ROM / Strength   AROM / PROM / Strength  AROM      AROM   AROM Assessment Site  Shoulder    Right/Left Shoulder  Right    Right Shoulder Extension  42 Degrees    Right Shoulder Flexion  110 Degrees    Right Shoulder ABduction  130 Degrees    Right Shoulder Internal Rotation  63 Degrees    Right Shoulder External Rotation  90 Degrees        LYMPHEDEMA/ONCOLOGY QUESTIONNAIRE - 08/25/17 1112      Type   Cancer  Type  Right breast      Surgeries   Lumpectomy Date  07/25/17    Sentinel Lymph Node Biopsy Date  07/25/17    Number Lymph Nodes Removed  2      Treatment   Active Chemotherapy Treatment  No    Past Chemotherapy Treatment  No    Active Radiation Treatment  No    Past Radiation Treatment  No    Current Hormone Treatment  No    Past Hormone Therapy  No      What other symptoms do you have   Are you Having Heaviness or Tightness  No    Are you having Pain  Yes    Are you having pitting edema  No    Is it Hard or Difficult finding clothes that fit  No    Do you have infections  No    Is there Decreased scar mobility  Yes    [place] Sign  No      Lymphedema Assessments   Lymphedema Assessments  Upper extremities      Right Upper Extremity Lymphedema   10 cm Proximal to Olecranon Process  30.8 cm    Olecranon Process  26.2 cm    10 cm Proximal to Ulnar Styloid Process  23.2 cm    Just Proximal to Ulnar Styloid Process  15 cm    Across Hand at [employer]  19.4 cm    At [place] of 2nd Digit  6 cm      Left Upper Extremity Lymphedema   10 cm Proximal to Olecranon Process  28.8 cm    Olecranon Process  24.5 cm    10 cm Proximal to Ulnar Styloid Process  22.4 cm    Just Proximal to Ulnar Styloid Process  14.7 cm    Across Hand at [employer]  18.7 cm    At [place] of 2nd Digit  6 cm        Quick Dash - 08/25/17 0001    Open a tight or new jar  No difficulty    Do heavy household chores (wash walls, wash floors)  No difficulty    Carry a shopping bag or briefcase  No difficulty  Wash your back  Mild difficulty    Use a knife to cut food  No difficulty    Recreational activities in which you take some force or impact through your arm, shoulder, or hand (golf, hammering, tennis)  Mild difficulty    During the past week, to what extent has your arm, shoulder or hand problem interfered with your normal social activities with family, friends, neighbors, or groups?  Slightly     During the past week, to what extent has your arm, shoulder or hand problem limited your work or other regular daily activities  Not at all    Arm, shoulder, or hand pain.  Mild    Tingling (pins and needles) in your arm, shoulder, or hand  Mild    Difficulty Sleeping  No difficulty    DASH Score  11.36 %                          PT Long Term Goals - 08/25/17 1157      PT LONG TERM GOAL #1   Title  Patient will demonstrate she has returned to baseline related to shoulder ROM and function post operatively.    Time  4    Period  Weeks    Status  On-going      PT LONG TERM GOAL #2   Title  Increase right shoulder flexion to >/= 140 degrees for increased ease reaching overhead.    Time  4    Period  Weeks    Status  New      PT LONG TERM GOAL #3   Title  Increase right shoulder abduction to >/= 145 degrees for increased ease reaching overhead and to tolerate radiation positioning.    Time  4    Period  Weeks    Status  New      PT LONG TERM GOAL #4   Title  Patient will report >/= 50% less tightness and discomfort at her incision sites due to scar tissue.    Time  4    Period  Weeks    Status  New      PT LONG TERM GOAL #5   Title  Patient will verbalize good understanding of risk reduction practices for lymphedema.    Time  4    Period  Weeks    Status  New         Plan - 08/25/17 1134    Clinical Impression Statement  Patient is doing well s/p right lumpectomy and SLNB on 07/25/17. She has significantly limited shoulder abduction and flexion and a scar that would benefit from manual therapy. She has what is possibly an axillary seroma that she reports has been present the past 4 days. Her surgeon as contacted as she has not seen him since this new symptom. She will benefit from PT to address ROM and her scar.    Clinical Presentation  Stable    Rehab Potential  Excellent    Clinical Impairments Affecting Rehab Potential  None    PT Frequency  2x /  week    PT Duration  4 weeks    PT Treatment/Interventions  ADLs/Self Care Home Management;Therapeutic exercise;Patient/family education;Scar mobilization;Passive range of motion;Manual techniques;DME Instruction    PT Next Visit Plan  PROM right shoulder, ROM exercises, gentle scar massage to right breast    PT Home Exercise Plan  Post op shoulder ROM HEP    Consulted and Agree  with Plan of Care  Patient       Patient will benefit from skilled therapeutic intervention in order to improve the following deficits and impairments:  Impaired UE functional use, Decreased knowledge of precautions, Decreased range of motion, Postural dysfunction, Pain, Increased edema, Decreased scar mobility  Visit Diagnosis: Carcinoma of upper-inner quadrant of right breast in female, estrogen receptor positive ([place]) - Plan: PT plan of care cert/re-cert  Abnormal posture - Plan: PT plan of care cert/re-cert  Aftercare following surgery for neoplasm - Plan: PT plan of care cert/re-cert  Stiffness of right shoulder, not elsewhere classified - Plan: PT plan of care cert/re-cert     Problem List Patient Active Problem List   Diagnosis Date Noted  . Genetic testing 07/13/2017  . Family history of prostate cancer   . Malignant neoplasm of upper-inner quadrant of right breast in female, estrogen receptor positive ([place]) 07/06/2017   Annia Friendly, PT 08/25/17 12:03 PM  [place], [place], [address] Phone: [phone]   Fax:  [phone]  Name: Melissa [name] MRN: [id number] Date of Birth: 03/18/1970

## 2017-08-26 DIAGNOSIS — Z510 Encounter for antineoplastic radiation therapy: Secondary | ICD-10-CM | POA: Diagnosis not present

## 2017-08-29 ENCOUNTER — Ambulatory Visit: Payer: 59

## 2017-08-29 ENCOUNTER — Ambulatory Visit
Admission: RE | Admit: 2017-08-29 | Discharge: 2017-08-29 | Disposition: A | Discharge status: Home / Self Care | Payer: 59 | Source: Ambulatory Visit | Attending: Radiation Oncology | Admitting: Radiation Oncology

## 2017-08-29 DIAGNOSIS — Z170 Estrogen receptor positive status [ER+]: Principal | ICD-10-CM

## 2017-08-29 DIAGNOSIS — C50211 Malignant neoplasm of upper-inner quadrant of right female breast: Secondary | ICD-10-CM

## 2017-08-29 DIAGNOSIS — Z483 Aftercare following surgery for neoplasm: Secondary | ICD-10-CM

## 2017-08-29 DIAGNOSIS — M25611 Stiffness of right shoulder, not elsewhere classified: Secondary | ICD-10-CM

## 2017-08-29 DIAGNOSIS — R293 Abnormal posture: Secondary | ICD-10-CM

## 2017-08-29 DIAGNOSIS — Z510 Encounter for antineoplastic radiation therapy: Secondary | ICD-10-CM | POA: Diagnosis not present

## 2017-08-29 MED ORDER — RADIAPLEXRX EX GEL
Freq: Once | CUTANEOUS | Status: AC
  Administered 2017-08-29: 12:00:00 via TOPICAL

## 2017-08-29 MED ORDER — ALRA NON-METALLIC DEODORANT (RAD-ONC)
1.0000 "application " | Freq: Once | TOPICAL | Status: AC
  Administered 2017-08-29: 1 via TOPICAL

## 2017-08-29 NOTE — Patient Instructions (Signed)

## 2017-08-29 NOTE — Progress Notes (Signed)

## 2017-08-29 NOTE — Therapy (Signed)
[place], [place], [address] Phone: [phone]   Fax:  [phone]  Physical Therapy Treatment  Patient Details  Name: Melissa [name] MRN: [id number] Date of Birth: 06/17/1970 Referring Provider: Dr. Alphonsa Overall   Encounter Date: 08/29/2017  PT End of Session - 08/29/17 0947    Visit Number  3    Number of Visits  10    Date for PT Re-Evaluation  09/22/17    PT Start Time  0935    PT Stop Time  1017    PT Time Calculation (min)  42 min    Activity Tolerance  Patient tolerated treatment well    Behavior During Therapy  [facility] for tasks assessed/performed       Past Medical History:  Diagnosis Date  . Cancer ([place]) 06/2017   right breast cancer  . Family history of prostate cancer     Past Surgical History:  Procedure Laterality Date  . BREAST LUMPECTOMY WITH RADIOACTIVE SEED AND SENTINEL LYMPH NODE BIOPSY Right 07/25/2017   Procedure: RIGHT BREAST LUMPECTOMY WITH RADIOACTIVE SEED AND SENTINEL LYMPH NODE BIOPSY;  Surgeon: Alphonsa Overall, MD;  Location: [place];  Service: General;  Laterality: Right;  . CESAREAN SECTION    . CHOLECYSTECTOMY      There were no vitals filed for this visit.  Subjective Assessment - 08/29/17 0937    Subjective  I saw Dr. Lucia Gaskins since I was here last and he thinks I just have scar tissue built up in my axilla.     Patient Stated Goals  Make sure my arm is ok    Currently in Pain?  No/denies                       [facility] Adult PT Treatment/Exercise - 08/29/17 0001      Shoulder Exercises: Supine   External Rotation  AAROM;Right;5 reps 5 sec holds with dowel    Flexion  AAROM;Both;5 reps 5 sec holds with dowel    ABduction  AAROM;Right;5 reps 5 sec holds with dowel    Other Supine Exercises  Also reviewed fingers clasped head for er stretch; all above exercises required tactile and VCs for correct technique      Manual Therapy   Manual Therapy  Myofascial release;Passive ROM    Myofascial Release  To Rt axilla and UE pulling throughout P/ROM    Passive ROM  In Supine to Rt shoulder into flexion, abduction and D2 all to pts tolerance             PT Education - 08/29/17 0941    Education provided  Yes    Education Details  Supine dowel exercises    Person(s) Educated  Patient    Methods  Explanation;Demonstration;Handout    Comprehension  Verbalized understanding;Returned demonstration;Need further instruction          PT Long Term Goals - 08/25/17 1157      PT LONG TERM GOAL #1   Title  Patient will demonstrate she has returned to baseline related to shoulder ROM and function post operatively.    Time  4    Period  Weeks    Status  On-going      PT LONG TERM GOAL #2   Title  Increase right shoulder flexion to >/= 140 degrees for increased ease reaching overhead.    Time  4    Period  Weeks    Status  New  PT LONG TERM GOAL #3   Title  Increase right shoulder abduction to >/= 145 degrees for increased ease reaching overhead and to tolerate radiation positioning.    Time  4    Period  Weeks    Status  New      PT LONG TERM GOAL #4   Title  Patient will report >/= 50% less tightness and discomfort at her incision sites due to scar tissue.    Time  4    Period  Weeks    Status  New      PT LONG TERM GOAL #5   Title  Patient will verbalize good understanding of risk reduction practices for lymphedema.    Time  4    Period  Weeks    Status  New      Breast Clinic Goals - 07/06/17 1124      Patient will be able to verbalize understanding of pertinent lymphedema risk reduction practices relevant to her diagnosis specifically related to skin care.   Time  1    Period  Days    Status  Achieved      Patient will be able to return demonstrate and/or verbalize understanding of the post-op home exercise program related to regaining shoulder range of motion.   Time  1    Period  Days     Status  Achieved      Patient will be able to verbalize understanding of the importance of attending the postoperative After Breast Cancer Class for further lymphedema risk reduction education and therapeutic exercise.   Time  1    Period  Days    Status  Achieved           Plan - 08/29/17 1018    Clinical Impression Statement  Educated pt today in supine dowel exercises which she tolerated well thogh did require demo and tactile cues for correct UE position for each. Then first session of manual therapy to Rt shoulder and axilla which she tolerated very well. Had troble relaxing at beginning of session but then improved by end. Pt reported shoulder feeling much looser at end of session and has first session of radiation today so looks forward to being able to relax better for this.     Rehab Potential  Excellent    Clinical Impairments Affecting Rehab Potential  None    PT Frequency  2x / week    PT Duration  4 weeks    PT Treatment/Interventions  ADLs/Self Care Home Management;Therapeutic exercise;Patient/family education;Scar mobilization;Passive range of motion;Manual techniques;DME Instruction    PT Next Visit Plan  Cont PROM right shoulder, ROM exercises, gentle scar massage to right breast; review HEP    Consulted and Agree with Plan of Care  Patient       Patient will benefit from skilled therapeutic intervention in order to improve the following deficits and impairments:  Impaired UE functional use, Decreased knowledge of precautions, Decreased range of motion, Postural dysfunction, Pain, Increased edema, Decreased scar mobility  Visit Diagnosis: Carcinoma of upper-inner quadrant of right breast in female, estrogen receptor positive (HCC)  Abnormal posture  Aftercare following surgery for neoplasm  Stiffness of right shoulder, not elsewhere classified     Problem List Patient Active Problem List   Diagnosis Date Noted  . Genetic testing 07/13/2017  . Family history  of prostate cancer   . Malignant neoplasm of upper-inner quadrant of right breast in female, estrogen receptor positive ([place]) 07/06/2017  Otelia Limes, PTA 08/29/2017, 10:20 AM  [place], [place], [address] Phone: [phone]   Fax:  [phone]  Name: Shereka Lafortune MRN: [id number] Date of Birth: 07/08/1970

## 2017-08-30 ENCOUNTER — Ambulatory Visit
Admission: RE | Admit: 2017-08-30 | Discharge: 2017-08-30 | Disposition: A | Discharge status: Home / Self Care | Payer: 59 | Source: Ambulatory Visit | Attending: Radiation Oncology | Admitting: Radiation Oncology

## 2017-08-30 DIAGNOSIS — Z510 Encounter for antineoplastic radiation therapy: Secondary | ICD-10-CM | POA: Diagnosis not present

## 2017-08-31 ENCOUNTER — Ambulatory Visit
Admission: RE | Admit: 2017-08-31 | Discharge: 2017-08-31 | Disposition: A | Discharge status: Home / Self Care | Payer: 59 | Source: Ambulatory Visit | Attending: Radiation Oncology | Admitting: Radiation Oncology

## 2017-08-31 DIAGNOSIS — Z510 Encounter for antineoplastic radiation therapy: Secondary | ICD-10-CM | POA: Diagnosis not present

## 2017-09-01 ENCOUNTER — Ambulatory Visit
Admission: RE | Admit: 2017-09-01 | Discharge: 2017-09-01 | Disposition: A | Discharge status: Home / Self Care | Payer: 59 | Source: Ambulatory Visit | Attending: Radiation Oncology | Admitting: Radiation Oncology

## 2017-09-01 DIAGNOSIS — Z510 Encounter for antineoplastic radiation therapy: Secondary | ICD-10-CM | POA: Diagnosis not present

## 2017-09-02 ENCOUNTER — Encounter: Payer: Self-pay | Admitting: Physical Therapy

## 2017-09-02 ENCOUNTER — Ambulatory Visit: Payer: 59 | Admitting: Physical Therapy

## 2017-09-02 ENCOUNTER — Ambulatory Visit
Admission: RE | Admit: 2017-09-02 | Discharge: 2017-09-02 | Disposition: A | Discharge status: Home / Self Care | Payer: 59 | Source: Ambulatory Visit | Attending: Radiation Oncology | Admitting: Radiation Oncology

## 2017-09-02 DIAGNOSIS — Z483 Aftercare following surgery for neoplasm: Secondary | ICD-10-CM

## 2017-09-02 DIAGNOSIS — C50211 Malignant neoplasm of upper-inner quadrant of right female breast: Secondary | ICD-10-CM

## 2017-09-02 DIAGNOSIS — M25611 Stiffness of right shoulder, not elsewhere classified: Secondary | ICD-10-CM

## 2017-09-02 DIAGNOSIS — R293 Abnormal posture: Secondary | ICD-10-CM

## 2017-09-02 DIAGNOSIS — Z510 Encounter for antineoplastic radiation therapy: Secondary | ICD-10-CM | POA: Diagnosis not present

## 2017-09-02 DIAGNOSIS — Z170 Estrogen receptor positive status [ER+]: Principal | ICD-10-CM

## 2017-09-02 NOTE — Therapy (Signed)
[place], [place], [address] Phone: [phone]   Fax:  [phone]  Physical Therapy Treatment  Patient Details  Name: Melissa [name] MRN: [id number] Date of Birth: 1970/10/16 Referring Provider: Dr. Alphonsa Overall   Encounter Date: 09/02/2017  PT End of Session - 09/02/17 0933    Visit Number  4    Number of Visits  10    Date for PT Re-Evaluation  09/22/17    PT Start Time  0931    PT Stop Time  1009    PT Time Calculation (min)  38 min    Activity Tolerance  Patient tolerated treatment well    Behavior During Therapy  [facility] for tasks assessed/performed       Past Medical History:  Diagnosis Date  . Cancer ([place]) 06/2017   right breast cancer  . Family history of prostate cancer     Past Surgical History:  Procedure Laterality Date  . BREAST LUMPECTOMY WITH RADIOACTIVE SEED AND SENTINEL LYMPH NODE BIOPSY Right 07/25/2017   Procedure: RIGHT BREAST LUMPECTOMY WITH RADIOACTIVE SEED AND SENTINEL LYMPH NODE BIOPSY;  Surgeon: Alphonsa Overall, MD;  Location: [place];  Service: General;  Laterality: Right;  . CESAREAN SECTION    . CHOLECYSTECTOMY      There were no vitals filed for this visit.  Subjective Assessment - 09/02/17 0933    Subjective  Had fluid from seroma drained this week. Feels alot better.     Pertinent History  Patient underwent a right lumpectomy and sentinel node biopsy (0/2 nodes) on 07/25/17.    Currently in Pain?  No/denies    Multiple Pain Sites  No         [facility] PT Assessment - 09/02/17 0001      AROM   Right Shoulder Flexion  144 Degrees    Right Shoulder ABduction  147 Degrees                   [facility] Adult PT Treatment/Exercise - 09/02/17 0001      Self-Care   Self-Care  Other Self-Care Comments    Other Self-Care Comments   Initiated educated on reducing lymphadema risk verbally, pt plans to sign up for ABC class.      Shoulder  Exercises: Standing   ABduction  -- Clock at wall: 12:00 & 11:00 holds 2x 30 sec    Other Standing Exercises  Counter top 90 degree angle stretch 2x 20 sec      Manual Therapy   Manual Therapy  Myofascial release;Passive ROM    Myofascial Release  To Rt axilla and UE pulling throughout P/ROM    Passive ROM  In Supine to Rt shoulder into all planes pts tolerance             PT Education - 09/02/17 0949    Education provided  Yes    Education Details  counter top stretch for HEP    Person(s) Educated  Patient    Methods  Explanation;Demonstration;Tactile cues;Verbal cues    Comprehension  Returned demonstration;Verbalized understanding          PT Long Term Goals - 09/02/17 0939      PT LONG TERM GOAL #4   Title  Patient will report >/= 50% less tightness and discomfort at her incision sites due to scar tissue.    Time  4    Period  Weeks    Status  Achieved 50% maybe even more  Breast Clinic Goals - 07/06/17 1124      Patient will be able to verbalize understanding of pertinent lymphedema risk reduction practices relevant to her diagnosis specifically related to skin care.   Time  1    Period  Days    Status  Achieved      Patient will be able to return demonstrate and/or verbalize understanding of the post-op home exercise program related to regaining shoulder range of motion.   Time  1    Period  Days    Status  Achieved      Patient will be able to verbalize understanding of the importance of attending the postoperative After Breast Cancer Class for further lymphedema risk reduction education and therapeutic exercise.   Time  1    Period  Days    Status  Achieved           Plan - 09/02/17 1011    Clinical Impression Statement  Pt independent and compliant with initial HEP. Progressed HEP today to include standing stretches for lats and axilla at the countertop and standing sagittal plane strteching at the wall.  Pt's AROM has greatly improved since  her eval, see measurements in the chart. Pt had seroma drained this week by her MD. She reports much less soreness and tightness after having his done. Pt reports already at least a 50% improvement in feeling less tight with increased mobility.    Rehab Potential  Excellent    Clinical Impairments Affecting Rehab Potential  None    PT Frequency  2x / week    PT Duration  4 weeks    PT Treatment/Interventions  ADLs/Self Care Home Management;Therapeutic exercise;Patient/family education;Scar mobilization;Passive range of motion;Manual techniques;DME Instruction    PT Next Visit Plan  Pt would like info on getting compression sleeve for tennis, review new stretches, manual techniques where needed    PT Home Exercise Plan  Post op shoulder ROM HEP    Consulted and Agree with Plan of Care  Patient       Patient will benefit from skilled therapeutic intervention in order to improve the following deficits and impairments:  Impaired UE functional use, Decreased knowledge of precautions, Decreased range of motion, Postural dysfunction, Pain, Increased edema, Decreased scar mobility  Visit Diagnosis: Carcinoma of upper-inner quadrant of right breast in female, estrogen receptor positive (HCC)  Abnormal posture  Aftercare following surgery for neoplasm  Stiffness of right shoulder, not elsewhere classified     Problem List Patient Active Problem List   Diagnosis Date Noted  . Genetic testing 07/13/2017  . Family history of prostate cancer   . Malignant neoplasm of upper-inner quadrant of right breast in female, estrogen receptor positive ([place]) 07/06/2017    Melissa [name], PTA 09/02/2017, 10:17 AM  [place], [place], [address] Phone: [phone]   Fax:  [phone]  Name: Melissa [name] MRN: [id number] Date of Birth: 1970/06/03

## 2017-09-05 ENCOUNTER — Ambulatory Visit
Admission: RE | Admit: 2017-09-05 | Discharge: 2017-09-05 | Disposition: A | Discharge status: Home / Self Care | Payer: 59 | Source: Ambulatory Visit | Attending: Radiation Oncology | Admitting: Radiation Oncology

## 2017-09-05 DIAGNOSIS — Z510 Encounter for antineoplastic radiation therapy: Secondary | ICD-10-CM | POA: Diagnosis not present

## 2017-09-06 ENCOUNTER — Ambulatory Visit: Payer: 59

## 2017-09-06 ENCOUNTER — Ambulatory Visit
Admission: RE | Admit: 2017-09-06 | Discharge: 2017-09-06 | Disposition: A | Discharge status: Home / Self Care | Payer: 59 | Source: Ambulatory Visit | Attending: Radiation Oncology | Admitting: Radiation Oncology

## 2017-09-06 DIAGNOSIS — Z483 Aftercare following surgery for neoplasm: Secondary | ICD-10-CM

## 2017-09-06 DIAGNOSIS — Z510 Encounter for antineoplastic radiation therapy: Secondary | ICD-10-CM | POA: Diagnosis not present

## 2017-09-06 DIAGNOSIS — R293 Abnormal posture: Secondary | ICD-10-CM

## 2017-09-06 DIAGNOSIS — C50211 Malignant neoplasm of upper-inner quadrant of right female breast: Secondary | ICD-10-CM | POA: Diagnosis not present

## 2017-09-06 DIAGNOSIS — Z170 Estrogen receptor positive status [ER+]: Principal | ICD-10-CM

## 2017-09-06 DIAGNOSIS — M25611 Stiffness of right shoulder, not elsewhere classified: Secondary | ICD-10-CM

## 2017-09-06 NOTE — Patient Instructions (Addendum)

## 2017-09-06 NOTE — Therapy (Signed)
[place], [place], [address] Phone: [phone]   Fax:  [phone]  Physical Therapy Treatment  Patient Details  Name: Melissa [name] MRN: [id number] Date of Birth: 09-10-1970 Referring Provider: Dr. Alphonsa Overall   Encounter Date: 09/06/2017  PT End of Session - 09/06/17 1518    Visit Number  5    Number of Visits  10    Date for PT Re-Evaluation  09/22/17    PT Start Time  6333    PT Stop Time  1518    PT Time Calculation (min)  40 min    Activity Tolerance  Patient tolerated treatment well    Behavior During Therapy  [facility] for tasks assessed/performed       Past Medical History:  Diagnosis Date  . Cancer ([place]) 06/2017   right breast cancer  . Family history of prostate cancer     Past Surgical History:  Procedure Laterality Date  . BREAST LUMPECTOMY WITH RADIOACTIVE SEED AND SENTINEL LYMPH NODE BIOPSY Right 07/25/2017   Procedure: RIGHT BREAST LUMPECTOMY WITH RADIOACTIVE SEED AND SENTINEL LYMPH NODE BIOPSY;  Surgeon: Alphonsa Overall, MD;  Location: [place];  Service: General;  Laterality: Right;  . CESAREAN SECTION    . CHOLECYSTECTOMY      There were no vitals filed for this visit.  Subjective Assessment - 09/06/17 1443    Subjective  My armpit still feels so much better from having seroma drained last week. I've been doing the self MLD and stretching.     Pertinent History  Patient underwent a right lumpectomy and sentinel node biopsy (0/2 nodes) on 07/25/17.    Patient Stated Goals  Make sure my arm is ok    Currently in Pain?  No/denies         [facility] PT Assessment - 09/06/17 0001      AROM   Right Shoulder Extension  55 Degrees    Right Shoulder Flexion  159 Degrees    Right Shoulder ABduction  153 Degrees    Right Shoulder Internal Rotation  65 Degrees                   [facility] Adult PT Treatment/Exercise - 09/06/17 0001      Shoulder Exercises:  Supine   Horizontal ABduction  Strengthening;Both;10 reps;Theraband    Theraband Level (Shoulder Horizontal ABduction)  Level 2 (Red)    External Rotation  Strengthening;Both;10 reps;Theraband    Theraband Level (Shoulder External Rotation)  Level 2 (Red)    Flexion  Strengthening;Both;10 reps;Theraband Narrow and Wide Grip, 10 times each    Theraband Level (Shoulder Flexion)  Level 2 (Red)    Diagonals  Strengthening;Right;Left;10 reps;Theraband    Theraband Level (Shoulder Diagonals)  Level 2 (Red)      Shoulder Exercises: Pulleys   Flexion  2 minutes    Flexion Limitations  VCs for technique and to decrease Rt scapular compensation    ABduction  2 minutes    ABduction Limitations  Demonstration and VCs to decrease side trunk lean and scapular compensation      Shoulder Exercises: Therapy Ball   Flexion  Both;10 reps 2 lbs on wrist; forward lean into end of stretch    Flexion Limitations  Demonstration for correct technique    ABduction  Right;10 reps 2 lbs on wrist; same side lean into end of stretch    ABduction Limitations  Demonstration for correct technique  PT Education - 09/06/17 1501    Education provided  Yes    Education Details  Supine scapular series with red theraband    Person(s) Educated  Patient    Methods  Explanation;Demonstration;Handout    Comprehension  Verbalized understanding;Returned demonstration          PT Long Term Goals - 09/02/17 0939      PT LONG TERM GOAL #4   Title  Patient will report >/= 50% less tightness and discomfort at her incision sites due to scar tissue.    Time  4    Period  Weeks    Status  Achieved 50% maybe even more          Plan - 09/06/17 1518    Clinical Impression Statement  Progressed pt to include AA/ROM exercises with and without 2 lb weight on wrists. Also added supine scapular series with red theraband which she tolerated very well. She reports noticing her A/ROM has improved well over the  past 1-2 weeks and is encouraged by this.     Rehab Potential  Excellent    Clinical Impairments Affecting Rehab Potential  None    PT Frequency  2x / week    PT Duration  4 weeks    PT Treatment/Interventions  ADLs/Self Care Home Management;Therapeutic exercise;Patient/family education;Scar mobilization;Passive range of motion;Manual techniques;DME Instruction    PT Next Visit Plan  Review HEP issued today and progress (consider 3 way raises next 1-2 visits); cont Rt shoulder end ROM stretching and myofascial release to incision     Recommended Other Services  Sent script to Dr. Lucia Gaskins for compression sleeve class 1    Consulted and Agree with Plan of Care  Patient       Patient will benefit from skilled therapeutic intervention in order to improve the following deficits and impairments:  Impaired UE functional use, Decreased knowledge of precautions, Decreased range of motion, Postural dysfunction, Pain, Increased edema, Decreased scar mobility  Visit Diagnosis: Carcinoma of upper-inner quadrant of right breast in female, estrogen receptor positive ([place])  Abnormal posture  Aftercare following surgery for neoplasm  Stiffness of right shoulder, not elsewhere classified     Problem List Patient Active Problem List   Diagnosis Date Noted  . Genetic testing 07/13/2017  . Family history of prostate cancer   . Malignant neoplasm of upper-inner quadrant of right breast in female, estrogen receptor positive ([place]) 07/06/2017    Otelia Limes, PTA 09/06/2017, 3:21 PM  [place], [place], [address] Phone: [phone]   Fax:  [phone]  Name: Melissa [name] MRN: [id number] Date of Birth: [birth year]

## 2017-09-07 ENCOUNTER — Ambulatory Visit
Admission: RE | Admit: 2017-09-07 | Discharge: 2017-09-07 | Disposition: A | Discharge status: Home / Self Care | Payer: 59 | Source: Ambulatory Visit | Attending: Radiation Oncology | Admitting: Radiation Oncology

## 2017-09-07 DIAGNOSIS — Z510 Encounter for antineoplastic radiation therapy: Secondary | ICD-10-CM | POA: Diagnosis not present

## 2017-09-08 ENCOUNTER — Encounter: Payer: Self-pay | Admitting: Rehabilitation

## 2017-09-08 ENCOUNTER — Ambulatory Visit: Payer: 59 | Admitting: Rehabilitation

## 2017-09-08 ENCOUNTER — Ambulatory Visit
Admission: RE | Admit: 2017-09-08 | Discharge: 2017-09-08 | Disposition: A | Discharge status: Home / Self Care | Payer: 59 | Source: Ambulatory Visit | Attending: Radiation Oncology | Admitting: Radiation Oncology

## 2017-09-08 DIAGNOSIS — Z510 Encounter for antineoplastic radiation therapy: Secondary | ICD-10-CM | POA: Diagnosis not present

## 2017-09-08 DIAGNOSIS — M25611 Stiffness of right shoulder, not elsewhere classified: Secondary | ICD-10-CM

## 2017-09-08 DIAGNOSIS — R293 Abnormal posture: Secondary | ICD-10-CM

## 2017-09-08 DIAGNOSIS — C50211 Malignant neoplasm of upper-inner quadrant of right female breast: Secondary | ICD-10-CM

## 2017-09-08 DIAGNOSIS — Z170 Estrogen receptor positive status [ER+]: Principal | ICD-10-CM

## 2017-09-08 DIAGNOSIS — Z483 Aftercare following surgery for neoplasm: Secondary | ICD-10-CM

## 2017-09-08 NOTE — Therapy (Signed)
[place], [place], [address] Phone: [phone]   Fax:  [phone]  Physical Therapy Treatment  Patient Details  Name: Melissa [name] MRN: [id number] Date of Birth: Dec 23, 1970 Referring Provider: Dr. Alphonsa Overall   Encounter Date: 09/08/2017  PT End of Session - 09/08/17 2119    Visit Number  6    Number of Visits  10    Date for PT Re-Evaluation  09/22/17    PT Start Time  3419    PT Stop Time  1425    PT Time Calculation (min)  40 min    Activity Tolerance  Patient tolerated treatment well    Behavior During Therapy  [facility] for tasks assessed/performed       Past Medical History:  Diagnosis Date  . Cancer ([place]) 06/2017   right breast cancer  . Family history of prostate cancer     Past Surgical History:  Procedure Laterality Date  . BREAST LUMPECTOMY WITH RADIOACTIVE SEED AND SENTINEL LYMPH NODE BIOPSY Right 07/25/2017   Procedure: RIGHT BREAST LUMPECTOMY WITH RADIOACTIVE SEED AND SENTINEL LYMPH NODE BIOPSY;  Surgeon: Alphonsa Overall, MD;  Location: [place];  Service: General;  Laterality: Right;  . CESAREAN SECTION    . CHOLECYSTECTOMY      There were no vitals filed for this visit.  Subjective Assessment - 09/08/17 1351    Subjective  Feels like it is still doing better.  I think I could go play tennis. "This is my last visit"    Pertinent History  Patient underwent a right lumpectomy and sentinel node biopsy (0/2 nodes) on 07/25/17.    Patient Stated Goals  start playing tennis again      Currently in Pain?  No/denies         [facility] PT Assessment - 09/08/17 0001      AROM   Overall AROM Comments  similar bilaterally with pull into full abduction      Strength   Overall Strength  Within functional limits for tasks performed slight weakness in ER        LYMPHEDEMA/ONCOLOGY QUESTIONNAIRE - 09/08/17 1417      Right Upper Extremity Lymphedema   10 cm Proximal  to Olecranon Process  32 cm    Olecranon Process  27 cm    10 cm Proximal to Ulnar Styloid Process  22.6 cm    Just Proximal to Ulnar Styloid Process  15.5 cm    Across Hand at [employer]  21 cm    At [place] of 2nd Digit  6.5 cm                [facility] Adult PT Treatment/Exercise - 09/08/17 0001      Exercises   Exercises  Other Exercises    Other Exercises   Reviewed final HEP: to include scapular series per last handout to address slight ER weakness, flexion and scaption raises, lateral trunk stretch, and slow return to tennis with use of compression garment             PT Education - 09/08/17 2118    Education provided  Yes    Education Details  final HEP; compression use QD x 4 weeks with swelling onset of use only during tennis as needed     Person(s) Educated  Patient    Methods  Explanation;Demonstration;Handout    Comprehension  Verbalized understanding          PT Long  Term Goals - 09/08/17 1415      PT LONG TERM GOAL #1   Title  Patient will demonstrate she has returned to baseline related to shoulder ROM and function post operatively.    Status  Partially Met      PT LONG TERM GOAL #2   Title  Increase right shoulder flexion to >/= 140 degrees for increased ease reaching overhead.    Status  Achieved      PT LONG TERM GOAL #3   Title  Increase right shoulder abduction to >/= 145 degrees for increased ease reaching overhead and to tolerate radiation positioning.    Status  Achieved      PT LONG TERM GOAL #4   Status  Achieved      PT LONG TERM GOAL #5   Title  Patient will verbalize good understanding of risk reduction practices for lymphedema.    Status  Achieved      Breast Clinic Goals - 07/06/17 1124      Patient will be able to verbalize understanding of pertinent lymphedema risk reduction practices relevant to her diagnosis specifically related to skin care.   Time  1    Period  Days    Status  Achieved      Patient will be able  to return demonstrate and/or verbalize understanding of the post-op home exercise program related to regaining shoulder range of motion.   Time  1    Period  Days    Status  Achieved      Patient will be able to verbalize understanding of the importance of attending the postoperative After Breast Cancer Class for further lymphedema risk reduction education and therapeutic exercise.   Time  1    Period  Days    Status  Achieved           Plan - 09/08/17 2119    Clinical Impression Statement  Nekeisha feels comfortable with HEP for continued ROM and strength of the Lt shoulder.  She has slight ER weakness and pull with end range abduction.  We will be calling her when the referral comes back for compression garment which she will wear when playing tennis and due to a slight increase in the proximal circumferential measurements she may wear it x 4 weeks QD to see if it reduces.     Consulted and Agree with Plan of Care  Patient       Patient will benefit from skilled therapeutic intervention in order to improve the following deficits and impairments:     Visit Diagnosis: Carcinoma of upper-inner quadrant of right breast in female, estrogen receptor positive ([place])  Abnormal posture  Aftercare following surgery for neoplasm  Stiffness of right shoulder, not elsewhere classified     Problem List Patient Active Problem List   Diagnosis Date Noted  . Genetic testing 07/13/2017  . Family history of prostate cancer   . Malignant neoplasm of upper-inner quadrant of right breast in female, estrogen receptor positive ([place]) 07/06/2017    Shan Levans, PT 09/08/2017, 9:25 PM  [place], [place], [address] Phone: [phone]   Fax:  [phone]  Name: Melissa [name] MRN: [id number] Date of Birth: 04/23/1970   PHYSICAL THERAPY DISCHARGE SUMMARY  Visits from Start of Care: 6  Current functional level  related to goals / functional outcomes: See above   Remaining deficits: ROM, strength,    Education / Equipment: See above Plan: Patient agrees  to discharge.  Patient goals were partially met. Patient is being discharged due to being pleased with the current functional level.  ?????

## 2017-09-09 ENCOUNTER — Ambulatory Visit
Admission: RE | Admit: 2017-09-09 | Discharge: 2017-09-09 | Disposition: A | Discharge status: Home / Self Care | Payer: 59 | Source: Ambulatory Visit | Attending: Radiation Oncology | Admitting: Radiation Oncology

## 2017-09-09 DIAGNOSIS — Z510 Encounter for antineoplastic radiation therapy: Secondary | ICD-10-CM | POA: Diagnosis not present

## 2017-09-12 ENCOUNTER — Ambulatory Visit
Admission: RE | Admit: 2017-09-12 | Discharge: 2017-09-12 | Disposition: A | Discharge status: Home / Self Care | Payer: 59 | Source: Ambulatory Visit | Attending: Radiation Oncology | Admitting: Radiation Oncology

## 2017-09-12 ENCOUNTER — Ambulatory Visit: Payer: 59 | Admitting: Radiation Oncology

## 2017-09-12 DIAGNOSIS — Z510 Encounter for antineoplastic radiation therapy: Secondary | ICD-10-CM | POA: Diagnosis not present

## 2017-09-13 ENCOUNTER — Ambulatory Visit
Admission: RE | Admit: 2017-09-13 | Discharge: 2017-09-13 | Disposition: A | Discharge status: Home / Self Care | Payer: 59 | Source: Ambulatory Visit | Attending: Radiation Oncology | Admitting: Radiation Oncology

## 2017-09-13 DIAGNOSIS — Z510 Encounter for antineoplastic radiation therapy: Secondary | ICD-10-CM | POA: Diagnosis not present

## 2017-09-14 ENCOUNTER — Ambulatory Visit: Payer: 59

## 2017-09-14 ENCOUNTER — Encounter (HOSPITAL_COMMUNITY): Payer: Self-pay

## 2017-09-14 ENCOUNTER — Observation Stay (HOSPITAL_COMMUNITY)
Admission: EM | Admit: 2017-09-14 | Discharge: 2017-09-16 | Disposition: A | Discharge status: Home / Self Care | Payer: 59 | Attending: Surgery | Admitting: Surgery

## 2017-09-14 ENCOUNTER — Other Ambulatory Visit: Payer: Self-pay

## 2017-09-14 DIAGNOSIS — Z853 Personal history of malignant neoplasm of breast: Secondary | ICD-10-CM | POA: Diagnosis not present

## 2017-09-14 DIAGNOSIS — T8149XA Infection following a procedure, other surgical site, initial encounter: Secondary | ICD-10-CM

## 2017-09-14 DIAGNOSIS — Y838 Other surgical procedures as the cause of abnormal reaction of the patient, or of later complication, without mention of misadventure at the time of the procedure: Secondary | ICD-10-CM | POA: Insufficient documentation

## 2017-09-14 DIAGNOSIS — Z8249 Family history of ischemic heart disease and other diseases of the circulatory system: Secondary | ICD-10-CM | POA: Diagnosis not present

## 2017-09-14 DIAGNOSIS — Z923 Personal history of irradiation: Secondary | ICD-10-CM | POA: Insufficient documentation

## 2017-09-14 DIAGNOSIS — N61 Mastitis without abscess: Secondary | ICD-10-CM

## 2017-09-14 DIAGNOSIS — Z9889 Other specified postprocedural states: Secondary | ICD-10-CM

## 2017-09-14 DIAGNOSIS — N6489 Other specified disorders of breast: Secondary | ICD-10-CM

## 2017-09-14 DIAGNOSIS — M96843 Postprocedural seroma of a musculoskeletal structure following other procedure: Secondary | ICD-10-CM | POA: Diagnosis present

## 2017-09-14 DIAGNOSIS — L03818 Cellulitis of other sites: Secondary | ICD-10-CM

## 2017-09-14 LAB — COMPREHENSIVE METABOLIC PANEL
ALK PHOS: 86 U/L (ref 38–126)
ALT: 23 U/L (ref 0–44)
ANION GAP: 9 (ref 5–15)
AST: 24 U/L (ref 15–41)
Albumin: 3.4 g/dL — ABNORMAL LOW (ref 3.5–5.0)
BILIRUBIN TOTAL: 0.8 mg/dL (ref 0.3–1.2)
BUN: 6 mg/dL (ref 6–20)
CALCIUM: 8.6 mg/dL — AB (ref 8.9–10.3)
CO2: 23 mmol/L (ref 22–32)
CREATININE: 0.81 mg/dL (ref 0.44–1.00)
Chloride: 108 mmol/L (ref 98–111)
Glucose, Bld: 163 mg/dL — ABNORMAL HIGH (ref 70–99)
Potassium: 3.5 mmol/L (ref 3.5–5.1)
Sodium: 140 mmol/L (ref 135–145)
TOTAL PROTEIN: 6.6 g/dL (ref 6.5–8.1)

## 2017-09-14 LAB — URINALYSIS, ROUTINE W REFLEX MICROSCOPIC
Bacteria, UA: NONE SEEN
Bilirubin Urine: NEGATIVE
GLUCOSE, UA: NEGATIVE mg/dL
KETONES UR: NEGATIVE mg/dL
Leukocytes, UA: NEGATIVE
Nitrite: NEGATIVE
PROTEIN: NEGATIVE mg/dL
Specific Gravity, Urine: 1.006 (ref 1.005–1.030)
pH: 5 (ref 5.0–8.0)

## 2017-09-14 LAB — CBC WITH DIFFERENTIAL/PLATELET
ABS IMMATURE GRANULOCYTES: 0.2 10*3/uL — AB (ref 0.0–0.1)
BASOS ABS: 0 10*3/uL (ref 0.0–0.1)
BASOS PCT: 0 %
Eosinophils Absolute: 0 10*3/uL (ref 0.0–0.7)
Eosinophils Relative: 0 %
HCT: 39.7 % (ref 36.0–46.0)
Hemoglobin: 13 g/dL (ref 12.0–15.0)
IMMATURE GRANULOCYTES: 1 %
Lymphocytes Relative: 5 %
Lymphs Abs: 0.9 10*3/uL (ref 0.7–4.0)
MCH: 28.4 pg (ref 26.0–34.0)
MCHC: 32.7 g/dL (ref 30.0–36.0)
MCV: 86.7 fL (ref 78.0–100.0)
Monocytes Absolute: 0.7 10*3/uL (ref 0.1–1.0)
Monocytes Relative: 4 %
NEUTROS ABS: 15.3 10*3/uL — AB (ref 1.7–7.7)
NEUTROS PCT: 90 %
PLATELETS: 240 10*3/uL (ref 150–400)
RBC: 4.58 MIL/uL (ref 3.87–5.11)
RDW: 14 % (ref 11.5–15.5)
WBC: 17 10*3/uL — ABNORMAL HIGH (ref 4.0–10.5)

## 2017-09-14 LAB — I-STAT CG4 LACTIC ACID, ED: Lactic Acid, Venous: 1.91 mmol/L — ABNORMAL HIGH (ref 0.5–1.9)

## 2017-09-14 LAB — I-STAT BETA HCG BLOOD, ED (MC, WL, AP ONLY)

## 2017-09-14 MED ORDER — ONDANSETRON 4 MG PO TBDP: 4.0000 mg | ORAL_TABLET | Freq: Four times a day (QID) | ORAL | Status: DC | PRN

## 2017-09-14 MED ORDER — LACTATED RINGERS IV SOLN
INTRAVENOUS | Status: DC
  Administered 2017-09-15 – 2017-09-16 (×2): via INTRAVENOUS

## 2017-09-14 MED ORDER — TRAMADOL HCL 50 MG PO TABS: 50.0000 mg | ORAL_TABLET | Freq: Four times a day (QID) | ORAL | Status: DC | PRN

## 2017-09-14 MED ORDER — ACETAMINOPHEN 325 MG PO TABS
650.0000 mg | ORAL_TABLET | Freq: Four times a day (QID) | ORAL | Status: DC
  Administered 2017-09-15 – 2017-09-16 (×5): 650 mg via ORAL
  Filled 2017-09-14 (×6): qty 2

## 2017-09-14 MED ORDER — VANCOMYCIN HCL IN DEXTROSE 1-5 GM/200ML-% IV SOLN
1000.0000 mg | Freq: Two times a day (BID) | INTRAVENOUS | Status: DC
  Administered 2017-09-15 (×3): 1000 mg via INTRAVENOUS
  Filled 2017-09-14 (×4): qty 200

## 2017-09-14 MED ORDER — ONDANSETRON HCL 4 MG/2ML IJ SOLN: 4.0000 mg | Freq: Four times a day (QID) | INTRAMUSCULAR | Status: DC | PRN

## 2017-09-14 MED ORDER — DIPHENHYDRAMINE HCL 50 MG/ML IJ SOLN: 12.5000 mg | Freq: Four times a day (QID) | INTRAMUSCULAR | Status: DC | PRN

## 2017-09-14 MED ORDER — HEPARIN SODIUM (PORCINE) 5000 UNIT/ML IJ SOLN
5000.0000 [IU] | Freq: Three times a day (TID) | INTRAMUSCULAR | Status: DC
  Administered 2017-09-15 – 2017-09-16 (×2): 5000 [IU] via SUBCUTANEOUS
  Filled 2017-09-14 (×3): qty 1

## 2017-09-14 MED ORDER — HYDRALAZINE HCL 20 MG/ML IJ SOLN: 10.0000 mg | INTRAMUSCULAR | Status: DC | PRN

## 2017-09-14 MED ORDER — DIPHENHYDRAMINE HCL 12.5 MG/5ML PO ELIX: 12.5000 mg | ORAL_SOLUTION | Freq: Four times a day (QID) | ORAL | Status: DC | PRN

## 2017-09-14 MED ORDER — IBUPROFEN 600 MG PO TABS
600.0000 mg | ORAL_TABLET | Freq: Four times a day (QID) | ORAL | Status: DC | PRN
  Administered 2017-09-15: 600 mg via ORAL
  Filled 2017-09-14: qty 1

## 2017-09-14 MED ORDER — SIMETHICONE 80 MG PO CHEW: 40.0000 mg | CHEWABLE_TABLET | Freq: Four times a day (QID) | ORAL | Status: DC | PRN

## 2017-09-14 NOTE — Progress Notes (Signed)
Pharmacy Antibiotic Note  Melissa [name] is a 47 y.o. female admitted on 09/14/2017 with right axillary cellulitis/abscess vs seroma .  Pharmacy has been consulted for Vancomycin dosing. WBC is elevated. Renal function good. Currently afebrile. Recent seroma drainage.   Plan: Vancomycin 1000 mg IV q12h Trend WBC, temp, renal function  F/U infectious work-up Drug levels as indicated   Height: 5\' 3"  (160 cm) Weight: 180 lb (81.6 kg) IBW/kg (Calculated) : 52.4  Temp (24hrs), Avg:98.3 F (36.8 C), Min:98.1 F (36.7 C), Max:98.4 F (36.9 C)  Recent Labs  Lab 09/14/17 2106 09/14/17 2115  WBC 17.0*  --   CREATININE 0.81  --   LATICACIDVEN  --  1.91*    Estimated Creatinine Clearance: 86.9 mL/min (by C-G formula based on SCr of 0.81 mg/dL).    No Known Allergies  Narda Bonds 09/14/2017 11:43 PM

## 2017-09-14 NOTE — ED Triage Notes (Signed)
Pt states that she was seen at her PCP today for fever and body aches with fever of 101 at [place] today. Pt states that she had right lumpectomy on June 10th and is currently receiving radiation with last treatment yesterday. Pt reports symptoms started yesterday around 1 pm.

## 2017-09-14 NOTE — ED Notes (Addendum)
Pt reports going to her PCP and having blood drawn this morning. Pt reports having a fever and feeling achy as well. Pt reports her PCP then being told her white count was elevated. Pt's PCP told her to go to the ER.

## 2017-09-14 NOTE — ED Provider Notes (Signed)
[facility] EMERGENCY DEPARTMENT Provider Note   CSN: [id number] Arrival date & time: 09/14/17  1723     History   Chief Complaint Chief Complaint  Patient presents with  . Fever    HPI Melissa [name] is a 47 y.o. female.  Patient is a 47 year old female with a history of recent lumpectomy with 2 lymph nodes dissected on the right and radiation therapy for breast cancer but no other significant medical history presenting today with 24 hours of fever and tenderness and fullness under the right axilla.  Patient states since the surgery she had had a seroma which approximately 1 or 2 weeks ago Dr. Lucia Gaskins drained in the office for comfort but yesterday suddenly she developed a fever.  She has not had cough, shortness of breath, nausea, vomiting or abdominal pain.  Her breast has been read from radiation but she is not sure if there is more redness than normal.  She saw her doctor today and had a leukocytosis of 16,000 and was sent here for concern for possible abscess near the surgical site.  Denies any swelling of her arm or change in sensation in the right hand.  The history is provided by the patient.    Past Medical History:  Diagnosis Date  . Cancer ([place]) 06/2017   right breast cancer  . Family history of prostate cancer     Patient Active Problem List   Diagnosis Date Noted  . Genetic testing 07/13/2017  . Family history of prostate cancer   . Malignant neoplasm of upper-inner quadrant of right breast in female, estrogen receptor positive ([place]) 07/06/2017    Past Surgical History:  Procedure Laterality Date  . BREAST LUMPECTOMY WITH RADIOACTIVE SEED AND SENTINEL LYMPH NODE BIOPSY Right 07/25/2017   Procedure: RIGHT BREAST LUMPECTOMY WITH RADIOACTIVE SEED AND SENTINEL LYMPH NODE BIOPSY;  Surgeon: Alphonsa Overall, MD;  Location: [facility];  Service: General;  Laterality: Right;  . CESAREAN SECTION    . CHOLECYSTECTOMY       OB History     None      Home Medications    Prior to Admission medications   Medication Sig Start Date End Date Taking? Authorizing Provider  acetaminophen (TYLENOL) 500 MG tablet Take 500 mg by mouth every 6 (six) hours as needed.    [provider]  fexofenadine (ALLEGRA) 180 MG tablet Take 180 mg by mouth daily as needed for allergies or rhinitis.    [provider]  naproxen sodium (ALEVE) 220 MG tablet Take 220 mg by mouth as needed.    [provider]    Family History Family History  Problem Relation Age of Onset  . Heart attack Mother 10       d. 61  . Diabetes Mother   . Hypertension Mother   . Prostate cancer Father   . Diabetes Father   . Kidney failure Father   . Hypertension Father   . Lung cancer Paternal Uncle        heavy smoker  . Arthritis Paternal Grandmother   . Osteoporosis Paternal Grandmother   . Diabetes Paternal Grandfather   . Kidney failure Paternal Grandfather   . Cancer Brother        stomach/colon cancer, d. 44; smoker    Social History Social History   Tobacco Use  . Smoking status: Never Smoker  . Smokeless tobacco: Never Used  Substance Use Topics  . Alcohol use: Yes    Alcohol/week: 0.6  oz    Types: 1 Glasses of wine per week    Comment: social  . Drug use: Never     Allergies   Patient has no known allergies.   Review of Systems Review of Systems  All other systems reviewed and are negative.    Physical Exam Updated Vital Signs BP 112/76   Pulse 97   Temp 98.4 F (36.9 C) (Oral)   Resp 18   Ht 5\' 3"  (1.6 m)   Wt 81.6 kg (180 lb)   LMP 09/11/2017   SpO2 97%   BMI 31.89 kg/m   Physical Exam  Constitutional: She is oriented to person, place, and time. She appears well-developed and well-nourished. No distress.  HENT:  Head: Normocephalic and atraumatic.  Mouth/Throat: Oropharynx is clear and moist.  Eyes: Pupils are equal, round, and reactive to light. Conjunctivae and EOM are normal.  Neck:  Normal range of motion. Neck supple.  Cardiovascular: Normal rate, regular rhythm and intact distal pulses.  No murmur heard. Pulmonary/Chest: Effort normal and breath sounds normal. No respiratory distress. She has no wheezes. She has no rales.    Abdominal: Soft. She exhibits no distension. There is no tenderness. There is no rebound and no guarding.  Musculoskeletal: Normal range of motion. She exhibits no edema or tenderness.  Neurological: She is alert and oriented to person, place, and time.  Skin: Skin is warm and dry. No rash noted. No erythema.  Psychiatric: She has a normal mood and affect. Her behavior is normal.  Nursing note and vitals reviewed.    ED Treatments / Results  Labs (all labs ordered are listed, but only abnormal results are displayed) Labs Reviewed  COMPREHENSIVE METABOLIC PANEL - Abnormal; Notable for the following components:      Result Value   Glucose, Bld 163 (*)    Calcium 8.6 (*)    Albumin 3.4 (*)    All other components within normal limits  CBC WITH DIFFERENTIAL/PLATELET - Abnormal; Notable for the following components:   WBC 17.0 (*)    Neutro Abs 15.3 (*)    Abs Immature Granulocytes 0.2 (*)    All other components within normal limits  URINALYSIS, ROUTINE W REFLEX MICROSCOPIC - Abnormal; Notable for the following components:   Hgb urine dipstick MODERATE (*)    All other components within normal limits  I-STAT CG4 LACTIC ACID, ED - Abnormal; Notable for the following components:   Lactic Acid, Venous 1.91 (*)    All other components within normal limits  I-STAT BETA HCG BLOOD, ED ([facility], [facility], AP ONLY)  I-STAT CG4 LACTIC ACID, ED    EKG None  Radiology No results found.  Procedures Procedures (including critical care time)  Medications Ordered in ED Medications - No data to display   Initial Impression / Assessment and Plan / ED Course  I have reviewed the triage vital signs and the nursing notes.  Pertinent labs & imaging  results that were available during my care of the patient were reviewed by me and considered in my medical decision making (see chart for details).     Patient presenting today with new onset fever as of yesterday and concern for possible abscess from recent surgical wound.  Patient has no open or draining wound in the right axilla but there is fullness and tenderness with palpation.  Patient's entire right breast is erythematous however she has been getting radiation and the last dose was yesterday and she states her breast has been  red since receiving radiation.  She has no breast tenderness or drainage from her nipple.  She has no other symptoms to suggest infection elsewhere such as URI symptoms, abdominal pain, urinary symptoms.  She is not experiencing any symptoms concerning for meningitis.  Patient has only received radiation and has not received any chemotherapy at any time.  Patient's lactic acid is less than 2 but white count is 17,000.  CMP without acute findings.  Spoke with Dr. Dema Severin who will come and see the patient to decide whether she will need admission or oral antibiotics and to see Dr. Lucia Gaskins tomorrow.  10:46 PM Pt admitted to general surgery and started on vancomycin  Final Clinical Impressions(s) / ED Diagnoses   Final diagnoses:  Abscess after procedure  Cellulitis of other specified site    ED Discharge Orders    None       Blanchie Dessert, MD 09/14/17 2247

## 2017-09-14 NOTE — H&P (Signed)
CC: Fever, malaise s/p right breast lumpectomy + SLNBx  HPI: Melissa [name] is an 47 y.o. female whom is a patient of my partner, Dr. Lucia Gaskins, who has a hx of right breast cancer s/p right breast lumpectomy + SLNBx 07/25/17. Path returned IDC, margins negative, SLNs negative. She is currently receiving adjuvant XRT. She ha developed a right axillary seroma a couple weeks ago and underwent aspiration in the office for this - she reports straw colored fluid aspirated. She did well and completed PT in the interim. Over the last week has had a slight fullness sensation in her right axilla and yesterday had a temp of 101.5. She noted redness to her right breast and axilla since starting XRT but nothing changed in terms of the redness recently. She denies any other complaints today.   Past Medical History:  Diagnosis Date  . Cancer ([place]) 06/2017   right breast cancer  . Family history of prostate cancer     Past Surgical History:  Procedure Laterality Date  . BREAST LUMPECTOMY WITH RADIOACTIVE SEED AND SENTINEL LYMPH NODE BIOPSY Right 07/25/2017   Procedure: RIGHT BREAST LUMPECTOMY WITH RADIOACTIVE SEED AND SENTINEL LYMPH NODE BIOPSY;  Surgeon: Alphonsa Overall, MD;  Location: [facility];  Service: General;  Laterality: Right;  . CESAREAN SECTION    . CHOLECYSTECTOMY      Family History  Problem Relation Age of Onset  . Heart attack Mother 39       d. 34  . Diabetes Mother   . Hypertension Mother   . Prostate cancer Father   . Diabetes Father   . Kidney failure Father   . Hypertension Father   . Lung cancer Paternal Uncle        heavy smoker  . Arthritis Paternal Grandmother   . Osteoporosis Paternal Grandmother   . Diabetes Paternal Grandfather   . Kidney failure Paternal Grandfather   . Cancer Brother        stomach/colon cancer, d. 48; smoker    Social:  reports that she has never smoked. She has never used smokeless tobacco. She reports that she drinks about 0.6 oz  of alcohol per week. She reports that she does not use drugs.  Allergies: No Known Allergies  Medications: I have reviewed the patient's current medications.  Results for orders placed or performed during the hospital encounter of 09/14/17 (from the past 48 hour(s))  Urinalysis, Routine w reflex microscopic     Status: Abnormal   Collection Time: 09/14/17  8:30 PM  Result Value Ref Range   Color, Urine YELLOW YELLOW   APPearance CLEAR CLEAR   Specific Gravity, Urine 1.006 1.005 - 1.030   pH 5.0 5.0 - 8.0   Glucose, UA NEGATIVE NEGATIVE mg/dL   Hgb urine dipstick MODERATE (A) NEGATIVE   Bilirubin Urine NEGATIVE NEGATIVE   Ketones, ur NEGATIVE NEGATIVE mg/dL   Protein, ur NEGATIVE NEGATIVE mg/dL   Nitrite NEGATIVE NEGATIVE   Leukocytes, UA NEGATIVE NEGATIVE   RBC / HPF 21-50 0 - 5 RBC/hpf   WBC, UA 0-5 0 - 5 WBC/hpf   Bacteria, UA NONE SEEN NONE SEEN   Squamous Epithelial / LPF 0-5 0 - 5   Mucus PRESENT     Comment: Performed at [facility], [address], [place], [place] [address]  Comprehensive metabolic panel     Status: Abnormal   Collection Time: 09/14/17  9:06 PM  Result Value Ref Range   Sodium 140 135 - 145  mmol/L   Potassium 3.5 3.5 - 5.1 mmol/L   Chloride 108 98 - 111 mmol/L   CO2 23 22 - 32 mmol/L   Glucose, Bld 163 (H) 70 - 99 mg/dL   BUN 6 6 - 20 mg/dL   Creatinine, Ser 0.81 0.44 - 1.00 mg/dL   Calcium 8.6 (L) 8.9 - 10.3 mg/dL   Total Protein 6.6 6.5 - 8.1 g/dL   Albumin 3.4 (L) 3.5 - 5.0 g/dL   AST 24 15 - 41 U/L   ALT 23 0 - 44 U/L   Alkaline Phosphatase 86 38 - 126 U/L   Total Bilirubin 0.8 0.3 - 1.2 mg/dL   GFR calc non Af Amer >60 >60 mL/min   GFR calc Af Amer >60 >60 mL/min    Comment: (NOTE) The eGFR has been calculated using the CKD EPI equation. This calculation has not been validated in all clinical situations. eGFR's persistently <60 mL/min signify possible Chronic Kidney Disease.    Anion gap 9 5 - 15    Comment: Performed at [facility] [address], [place], [place] [address]  CBC with Differential     Status: Abnormal   Collection Time: 09/14/17  9:06 PM  Result Value Ref Range   WBC 17.0 (H) 4.0 - 10.5 K/uL   RBC 4.58 3.87 - 5.11 MIL/uL   Hemoglobin 13.0 12.0 - 15.0 g/dL   HCT 39.7 36.0 - 46.0 %   MCV 86.7 78.0 - 100.0 fL   MCH 28.4 26.0 - 34.0 pg   MCHC 32.7 30.0 - 36.0 g/dL   RDW 14.0 11.5 - 15.5 %   Platelets 240 150 - 400 K/uL   Neutrophils Relative % 90 %   Neutro Abs 15.3 (H) 1.7 - 7.7 K/uL   Lymphocytes Relative 5 %   Lymphs Abs 0.9 0.7 - 4.0 K/uL   Monocytes Relative 4 %   Monocytes Absolute 0.7 0.1 - 1.0 K/uL   Eosinophils Relative 0 %   Eosinophils Absolute 0.0 0.0 - 0.7 K/uL   Basophils Relative 0 %   Basophils Absolute 0.0 0.0 - 0.1 K/uL   Immature Granulocytes 1 %   Abs Immature Granulocytes 0.2 (H) 0.0 - 0.1 K/uL    Comment: Performed at [facility], [address] [place], [place] [address]  I-Stat beta hCG blood, ED     Status: None   Collection Time: 09/14/17  9:13 PM  Result Value Ref Range   I-stat hCG, quantitative <5.0 <5 mIU/mL   Comment 3            Comment:   GEST. AGE      CONC.  (mIU/mL)   <=1 WEEK        5 - 50     2 WEEKS       50 - 500     3 WEEKS       100 - 10,000     4 WEEKS     1,000 - 30,000        FEMALE AND NON-PREGNANT FEMALE:     LESS THAN 5 mIU/mL   I-Stat CG4 Lactic Acid, ED     Status: Abnormal   Collection Time: 09/14/17  9:15 PM  Result Value Ref Range   Lactic Acid, Venous 1.91 (H) 0.5 - 1.9 mmol/L    No results found.  ROS - all of the below systems have been reviewed with the patient and positives are indicated with bold  text General: chills, fever or night sweats Eyes: blurry vision or double vision ENT: epistaxis or sore throat Allergy/Immunology: itchy/watery eyes or nasal congestion Hematologic/Lymphatic: bleeding problems, blood clots or swollen lymph nodes Endocrine: temperature intolerance or unexpected weight  changes Breast: new or changing breast lumps or nipple discharge. Otherwise as per HPI Resp: cough, shortness of breath, or wheezing CV: chest pain or dyspnea on exertion GI: as per HPI GU: dysuria, trouble voiding, or hematuria MSK: joint pain or joint stiffness Neuro: TIA or stroke symptoms Derm: pruritus and skin lesion changes Psych: anxiety and depression  PE Blood pressure 112/76, pulse 97, temperature 98.4 F (36.9 C), temperature source Oral, resp. rate 18, height '5\' 3"'$  (1.6 m), weight 81.6 kg (180 lb), last menstrual period 09/11/2017, SpO2 97 %. Constitutional: NAD; conversant; no deformities. Not toxic appearing. Eyes: Moist conjunctiva; no lid lag; anicteric; PERRL Neck: Trachea midline; no thyromegaly Lungs: Normal respiratory effort; no tactile fremitus Breast: Right breast incision flat; entire breast erythematous, nontender. Right axilla with redness as well, no significant tenderness but fullness noted that feels quite deep. CV: RRR; no palpable thrills; no pitting edema GI: Abd soft, NT/ND; no palpable hepatosplenomegaly MSK: Normal gait; no clubbing/cyanosis Psychiatric: Appropriate affect; alert and oriented x3 Lymphatic: No palpable cervical or axillary lymphadenopathy  Results for orders placed or performed during the hospital encounter of 09/14/17 (from the past 48 hour(s))  Urinalysis, Routine w reflex microscopic     Status: Abnormal   Collection Time: 09/14/17  8:30 PM  Result Value Ref Range   Color, Urine YELLOW YELLOW   APPearance CLEAR CLEAR   Specific Gravity, Urine 1.006 1.005 - 1.030   pH 5.0 5.0 - 8.0   Glucose, UA NEGATIVE NEGATIVE mg/dL   Hgb urine dipstick MODERATE (A) NEGATIVE   Bilirubin Urine NEGATIVE NEGATIVE   Ketones, ur NEGATIVE NEGATIVE mg/dL   Protein, ur NEGATIVE NEGATIVE mg/dL   Nitrite NEGATIVE NEGATIVE   Leukocytes, UA NEGATIVE NEGATIVE   RBC / HPF 21-50 0 - 5 RBC/hpf   WBC, UA 0-5 0 - 5 WBC/hpf   Bacteria, UA NONE SEEN NONE  SEEN   Squamous Epithelial / LPF 0-5 0 - 5   Mucus PRESENT     Comment: Performed at [facility], [address] [place], [place] [address]  Comprehensive metabolic panel     Status: Abnormal   Collection Time: 09/14/17  9:06 PM  Result Value Ref Range   Sodium 140 135 - 145 mmol/L   Potassium 3.5 3.5 - 5.1 mmol/L   Chloride 108 98 - 111 mmol/L   CO2 23 22 - 32 mmol/L   Glucose, Bld 163 (H) 70 - 99 mg/dL   BUN 6 6 - 20 mg/dL   Creatinine, Ser 0.81 0.44 - 1.00 mg/dL   Calcium 8.6 (L) 8.9 - 10.3 mg/dL   Total Protein 6.6 6.5 - 8.1 g/dL   Albumin 3.4 (L) 3.5 - 5.0 g/dL   AST 24 15 - 41 U/L   ALT 23 0 - 44 U/L   Alkaline Phosphatase 86 38 - 126 U/L   Total Bilirubin 0.8 0.3 - 1.2 mg/dL   GFR calc non Af Amer >60 >60 mL/min   GFR calc Af Amer >60 >60 mL/min    Comment: (NOTE) The eGFR has been calculated using the CKD EPI equation. This calculation has not been validated in all clinical situations. eGFR's persistently <60 mL/min signify possible Chronic Kidney Disease.    Anion gap 9 5 -  15    Comment: Performed at [facility], [place] [address], [place], [place] [address]  CBC with Differential     Status: Abnormal   Collection Time: 09/14/17  9:06 PM  Result Value Ref Range   WBC 17.0 (H) 4.0 - 10.5 K/uL   RBC 4.58 3.87 - 5.11 MIL/uL   Hemoglobin 13.0 12.0 - 15.0 g/dL   HCT 39.7 36.0 - 46.0 %   MCV 86.7 78.0 - 100.0 fL   MCH 28.4 26.0 - 34.0 pg   MCHC 32.7 30.0 - 36.0 g/dL   RDW 14.0 11.5 - 15.5 %   Platelets 240 150 - 400 K/uL   Neutrophils Relative % 90 %   Neutro Abs 15.3 (H) 1.7 - 7.7 K/uL   Lymphocytes Relative 5 %   Lymphs Abs 0.9 0.7 - 4.0 K/uL   Monocytes Relative 4 %   Monocytes Absolute 0.7 0.1 - 1.0 K/uL   Eosinophils Relative 0 %   Eosinophils Absolute 0.0 0.0 - 0.7 K/uL   Basophils Relative 0 %   Basophils Absolute 0.0 0.0 - 0.1 K/uL   Immature Granulocytes 1 %   Abs Immature Granulocytes 0.2 (H) 0.0 - 0.1 K/uL    Comment: Performed at  [facility], [address], [place], [place] [address]  I-Stat beta hCG blood, ED     Status: None   Collection Time: 09/14/17  9:13 PM  Result Value Ref Range   I-stat hCG, quantitative <5.0 <5 mIU/mL   Comment 3            Comment:   GEST. AGE      CONC.  (mIU/mL)   <=1 WEEK        5 - 50     2 WEEKS       50 - 500     3 WEEKS       100 - 10,000     4 WEEKS     1,000 - 30,000        FEMALE AND NON-PREGNANT FEMALE:     LESS THAN 5 mIU/mL   I-Stat CG4 Lactic Acid, ED     Status: Abnormal   Collection Time: 09/14/17  9:15 PM  Result Value Ref Range   Lactic Acid, Venous 1.91 (H) 0.5 - 1.9 mmol/L    No results found.   A/P: Melissa [name] is an 47 y.o. female with probable right axillary seroma s/p R breast lumpectomy + SLNBx 6/10  -WBC 17 and recent fever. Could have underlying infection vs recurrent seroma. Given all of this, will plan to admit to hospital, obtain right axillary [place] and start her on empiric IV abx -If sizable collection noted, which I suspect based on her exam, will discuss plans moving forward with Dr. Lucia Gaskins - repeat aspiration vs IR drainage -NPO after midnight tonight  Sharon Mt. Dema Severin, M.D. [facility]

## 2017-09-14 NOTE — ED Notes (Signed)
2 attempts for blood draw, unable to obtain.

## 2017-09-14 NOTE — ED Notes (Signed)
attempted X1 to get blood unsuccessful

## 2017-09-15 ENCOUNTER — Telehealth: Payer: Self-pay

## 2017-09-15 ENCOUNTER — Ambulatory Visit: Payer: 59

## 2017-09-15 ENCOUNTER — Observation Stay (HOSPITAL_COMMUNITY): Payer: 59

## 2017-09-15 DIAGNOSIS — Z510 Encounter for antineoplastic radiation therapy: Secondary | ICD-10-CM | POA: Diagnosis present

## 2017-09-15 DIAGNOSIS — C50211 Malignant neoplasm of upper-inner quadrant of right female breast: Secondary | ICD-10-CM | POA: Diagnosis not present

## 2017-09-15 DIAGNOSIS — Z170 Estrogen receptor positive status [ER+]: Secondary | ICD-10-CM | POA: Diagnosis not present

## 2017-09-15 LAB — HIV ANTIBODY (ROUTINE TESTING W REFLEX): HIV SCREEN 4TH GENERATION: NONREACTIVE

## 2017-09-15 LAB — CBC WITH DIFFERENTIAL/PLATELET
BASOS ABS: 0 10*3/uL (ref 0.0–0.1)
Basophils Relative: 0 %
EOS ABS: 0 10*3/uL (ref 0.0–0.7)
Eosinophils Relative: 0 %
HCT: 40.2 % (ref 36.0–46.0)
Hemoglobin: 12.8 g/dL (ref 12.0–15.0)
Lymphocytes Relative: 10 %
Lymphs Abs: 1.5 10*3/uL (ref 0.7–4.0)
MCH: 28.1 pg (ref 26.0–34.0)
MCHC: 31.8 g/dL (ref 30.0–36.0)
MCV: 88.2 fL (ref 78.0–100.0)
MONO ABS: 0.9 10*3/uL (ref 0.1–1.0)
Monocytes Relative: 6 %
NEUTROS PCT: 84 %
Neutro Abs: 12.6 10*3/uL — ABNORMAL HIGH (ref 1.7–7.7)
PLATELETS: UNDETERMINED 10*3/uL (ref 150–400)
RBC: 4.56 MIL/uL (ref 3.87–5.11)
RDW: 14.2 % (ref 11.5–15.5)
WBC: 15 10*3/uL — AB (ref 4.0–10.5)

## 2017-09-15 MED ORDER — LIDOCAINE HCL (PF) 1 % IJ SOLN
INTRAMUSCULAR | Status: AC
  Filled 2017-09-15: qty 30

## 2017-09-15 NOTE — Procedures (Signed)
US guided aspiration of right breast/axilla seroma.  60 ml of cloudy yellow fluid was removed.  Collection was decompressed after aspiration with minimal residual fluid.  Minimal blood loss and no immediate complication.

## 2017-09-15 NOTE — Progress Notes (Addendum)
CC:  Pain right arm  Subjective: Pt had this drained in the office x 1 and it has reoccurred with some fever and pain.  Fever is better, we will ask IR to drain today.  Then she can eat.    Objective: Vital signs in last 24 hours: Temp:  [98.1 F (36.7 C)-99.3 F (37.4 C)] 98.7 F (37.1 C) (08/01 1002) Pulse Rate:  [90-111] 96 (08/01 1002) Resp:  [16-19] 16 (08/01 1002) BP: (97-123)/(55-94) 112/69 (08/01 1002) SpO2:  [95 %-100 %] 97 % (08/01 1002) Weight:  [81.6 kg (180 lb)] 81.6 kg (180 lb) (07/31 2333)   480 PO this AM Voided x 3 Afebrile, VSS WBC 17K >> 15 K this AM Glucose up on admit 163 5.8 cm RIGHT axillary fluid collection - unable to determine sonographically if this is infected/abscess. Intake/Output from previous day: 07/31 0701 - 08/01 0700 In: 200 [IV Piggyback:200] Out: -  Intake/Output this shift: Total I/O In: 480 [P.O.:480] Out: -   General appearance: alert, cooperative and no distress Chest wall: no tenderness, Right axillary tenderness Breasts: normal appearance, no masses or tenderness, swelling and some erythema on the right.   Lab Results:  Recent Labs    09/14/17 2106 09/15/17 0451  WBC 17.0* 15.0*  HGB 13.0 12.8  HCT 39.7 40.2  PLT 240 PLATELET CLUMPS NOTED ON SMEAR, UNABLE TO ESTIMATE    BMET Recent Labs    09/14/17 2106  NA 140  K 3.5  CL 108  CO2 23  GLUCOSE 163*  BUN 6  CREATININE 0.81  CALCIUM 8.6*   PT/INR No results for input(s): LABPROT, INR in the last 72 hours.  Recent Labs  Lab 09/14/17 2106  AST 24  ALT 23  ALKPHOS 86  BILITOT 0.8  PROT 6.6  ALBUMIN 3.4*     Lipase  No results found for: LIPASE   Prior to Admission medications   Medication Sig Start Date End Date Taking? Authorizing Provider  acetaminophen (TYLENOL) 500 MG tablet Take 500 mg by mouth every 6 (six) hours as needed.   Yes [provider]  fexofenadine (ALLEGRA) 180 MG tablet Take 180 mg by mouth daily as needed for  allergies or rhinitis.   Yes [provider]  Homeopathic Products (RAPID GEL RX) GEL Apply 1 application topically daily. Apply to right breast after radiation daily.   Yes [provider]  ibuprofen (ADVIL,MOTRIN) 200 MG tablet Take 200 mg by mouth every 6 (six) hours as needed for fever, headache, moderate pain or cramping.   Yes [provider]  naproxen sodium (ALEVE) 220 MG tablet Take 220 mg by mouth as needed.   Yes [provider]    Medications: . acetaminophen  650 mg Oral Q6H  . heparin  5,000 Units Subcutaneous Q8H   . lactated ringers 100 mL/hr at 09/15/17 1119  . vancomycin 1,000 mg (09/15/17 1116)   Anti-infectives (From admission, onward)   Start     Dose/Rate Route Frequency Ordered Stop   09/14/17 2345  vancomycin (VANCOCIN) IVPB 1000 mg/200 mL premix     1,000 mg 200 mL/hr over 60 Minutes Intravenous Every 12 hours 09/14/17 2341       Assessment/Plan   Right axillary seroma, status post: Right breast cancer status post lumpectomy and lymph node biopsy, 07/25/2017 Dr. Alphonsa Overall  FEN:  IV fluids/clears ID:   Vancomycin DVT:  Heparin  Follow up:  Dr. Lucia Gaskins   Plan: We are asking IR to  see and drain seroma.  Culture/gram stain.  Continue Vancomycin and recheck labs in AM.  Her husband and she are anxious to go home ASAP.   LOS: 0 days    Melissa [name] 09/15/2017 [phone]

## 2017-09-15 NOTE — Telephone Encounter (Signed)
I received a phone call from Ms. Copeman's husband. She was admitted to the hospital yesterday after presenting the her PCP with redness and swelling to her right breast. She was admitted with right breast abscess and cellulitis. I have notified Dr. Sondra Come and we will hold her radiation today and tomorrow. I have spoken to Ms. Sharlett Iles as well and she tells me that they plan to perform a needle drainage in Interventional Radiology today and she will most likely be discharged late tomorrow. She will also discuss continuing radiation with her hospital physicians and let me know on Monday. I have told her that Dr. Sondra Come would like to see her before her treatment on Monday if she is cleared for radiation.

## 2017-09-15 NOTE — Consult Note (Signed)
Chief Complaint: Patient was seen in consultation today for  Right axillary seroma aspiration Chief Complaint  Patient presents with  . Fever   at the request of Dr Nydia Bouton   Supervising Physician: Markus Daft  Patient Status: [facility] - In-pt  History of Present Illness: Melissa [name] is a 47 y.o. female   Right breast cancer Lumpectomy and lymph node biopsy Dr Nydia Bouton 07/25/17 Developed seroma dn had aspiration in office 3 weeks ago  Presented to ED yesterday with swelling and redness- fluctuant collection same spot [place] today: IMPRESSION: 5.8 cm RIGHT axillary fluid collection - unable to determine sonographically if this is infected/abscess  Request for US guided aspiration in Radiology Dr Anselm Pancoast has reviewed imaging and approves procedure    Past Medical History:  Diagnosis Date  . Cancer ([place]) 06/2017   right breast cancer  . Family history of prostate cancer     Past Surgical History:  Procedure Laterality Date  . BREAST LUMPECTOMY WITH RADIOACTIVE SEED AND SENTINEL LYMPH NODE BIOPSY Right 07/25/2017   Procedure: RIGHT BREAST LUMPECTOMY WITH RADIOACTIVE SEED AND SENTINEL LYMPH NODE BIOPSY;  Surgeon: Alphonsa Overall, MD;  Location: [facility];  Service: General;  Laterality: Right;  . CESAREAN SECTION    . CHOLECYSTECTOMY      Allergies: Patient has no known allergies.  Medications: Prior to Admission medications   Medication Sig Start Date End Date Taking? Authorizing Provider  acetaminophen (TYLENOL) 500 MG tablet Take 500 mg by mouth every 6 (six) hours as needed.   Yes [provider]  fexofenadine (ALLEGRA) 180 MG tablet Take 180 mg by mouth daily as needed for allergies or rhinitis.   Yes [provider]  Homeopathic Products (RAPID GEL RX) GEL Apply 1 application topically daily. Apply to right breast after radiation daily.   Yes [provider]  ibuprofen (ADVIL,MOTRIN) 200 MG tablet Take 200 mg by mouth  every 6 (six) hours as needed for fever, headache, moderate pain or cramping.   Yes [provider]  naproxen sodium (ALEVE) 220 MG tablet Take 220 mg by mouth as needed.   Yes [provider]     Family History  Problem Relation Age of Onset  . Heart attack Mother 24       d. 34  . Diabetes Mother   . Hypertension Mother   . Prostate cancer Father   . Diabetes Father   . Kidney failure Father   . Hypertension Father   . Lung cancer Paternal Uncle        heavy smoker  . Arthritis Paternal Grandmother   . Osteoporosis Paternal Grandmother   . Diabetes Paternal Grandfather   . Kidney failure Paternal Grandfather   . Cancer Brother        stomach/colon cancer, d. 62; smoker    Social History   Socioeconomic History  . Marital status: Married    Spouse name: [name]  . Number of children: 1  . Years of education: Not on file  . Highest education level: Not on file  Occupational History  . Not on file  Social Needs  . Financial resource strain: Not on file  . Food insecurity:    Worry: Not on file    Inability: Not on file  . Transportation needs:    Medical: Not on file    Non-medical: Not on file  Tobacco Use  . Smoking status: Never Smoker  . Smokeless tobacco: Never Used  Substance and Sexual  Activity  . Alcohol use: Yes    Alcohol/week: 0.6 oz    Types: 1 Glasses of wine per week    Comment: social  . Drug use: Never  . Sexual activity: Yes    Birth control/protection: Condom    Comment: stopped BCP 07-04-17  Lifestyle  . Physical activity:    Days per week: Not on file    Minutes per session: Not on file  . Stress: Not on file  Relationships  . Social connections:    Talks on phone: Not on file    Gets together: Not on file    Attends religious service: Not on file    Active member of club or organization: Not on file    Attends meetings of clubs or organizations: Not on file    Relationship status: Not on file  Other Topics Concern    . Not on file  Social History Narrative  . Not on file    Review of Systems: A 12 point ROS discussed and pertinent positives are indicated in the HPI above.  All other systems are negative.  Review of Systems  Vital Signs: BP 121/60 (BP Location: Left Arm)   Pulse 100   Temp 98.3 F (36.8 C) (Oral)   Resp 18   Ht 5\' 3"  (1.6 m)   Wt 180 lb (81.6 kg)   LMP 09/11/2017   SpO2 100%   BMI 31.89 kg/m   Physical Exam  Constitutional: She is oriented to person, place, and time.  Cardiovascular: Normal rate and regular rhythm.  Pulmonary/Chest: Effort normal and breath sounds normal.  Neurological: She is alert and oriented to person, place, and time.  Skin: Skin is warm and dry.  Skin site is reddened and tender Palpable collection beneath skin  Psychiatric: She has a normal mood and affect. Her behavior is normal.  Nursing note and vitals reviewed.   Imaging: [place] Rt Upper Extrem Ltd Soft Tissue Non Vascular  Result Date: 09/15/2017 CLINICAL DATA:  47 year old female with history of RIGHT breast cancer status post RIGHT lumpectomy and sentinel lymph node excision on 07/25/2017, currently undergoing radiation therapy. RIGHT axillary seroma aspiration several weeks ago, now with fever and RIGHT axillary redness. EXAM: ULTRASOUND OF THE RIGHT AXILLA COMPARISON:  None FINDINGS: Ultrasound of the RIGHT axilla performed demonstrating a 5.1 x 5.8 x 5.8 cm fluid collection in the RIGHT axilla. No other abnormalities in the RIGHT axilla identified. IMPRESSION: 5.8 cm RIGHT axillary fluid collection - unable to determine sonographically if this is infected/abscess. RECOMMENDATION: Clinical follow-up. Electronically Signed   By: Margarette Canada M.D.   On: 09/15/2017 08:39    Labs:  CBC: Recent Labs    07/06/17 0820 09/14/17 2106 09/15/17 0451  WBC 8.0 17.0* 15.0*  HGB 13.3 13.0 12.8  HCT 40.4 39.7 40.2  PLT 258 240 PLATELET CLUMPS NOTED ON SMEAR, UNABLE TO ESTIMATE    COAGS: No  results for input(s): INR, APTT in the last 8760 hours.  BMP: Recent Labs    07/06/17 0820 09/14/17 2106  NA 142 140  K 4.4 3.5  CL 109 108  CO2 22 23  GLUCOSE 103 163*  BUN 10 6  CALCIUM 8.9 8.6*  CREATININE 0.90 0.81  GFRNONAA >60 >60  GFRAA >60 >60    LIVER FUNCTION TESTS: Recent Labs    07/06/17 0820 09/14/17 2106  BILITOT 0.4 0.8  AST 25 24  ALT 25 23  ALKPHOS 81 86  PROT 6.8 6.6  ALBUMIN 3.6  3.4*    TUMOR MARKERS: No results for input(s): AFPTM, CEA, CA199, CHROMGRNA in the last 8760 hours.  Assessment and Plan:  Rt breast cancer Lumpectomy and LN bx 6/10 Seroma aspirated approx 3 weeks ago in office Now recurrence- reddened and tender Scheduled for aspiration today in Radiology Risks and benefits discussed with the patient including, but not limited to bleeding, infection, damage to adjacent structures or low yield requiring additional tests.  All of the patient's questions were answered, patient is agreeable to proceed. Consent signed and in chart.   Thank you for this interesting consult.  I greatly enjoyed meeting Melissa [name] and look forward to participating in their care.  A copy of this report was sent to the requesting provider on this date.  Electronically Signed: Lavonia Drafts, PA-C 09/15/2017, 3:12 PM   I spent a total of 20 Minutes    in face to face in clinical consultation, greater than 50% of which was counseling/coordinating care for right axillary seroma aspiration

## 2017-09-16 ENCOUNTER — Ambulatory Visit: Payer: 59

## 2017-09-16 LAB — CBC
HEMATOCRIT: 36.2 % (ref 36.0–46.0)
Hemoglobin: 11.5 g/dL — ABNORMAL LOW (ref 12.0–15.0)
MCH: 28 pg (ref 26.0–34.0)
MCHC: 31.8 g/dL (ref 30.0–36.0)
MCV: 88.1 fL (ref 78.0–100.0)
Platelets: 230 10*3/uL (ref 150–400)
RBC: 4.11 MIL/uL (ref 3.87–5.11)
RDW: 14 % (ref 11.5–15.5)
WBC: 11.8 10*3/uL — AB (ref 4.0–10.5)

## 2017-09-16 LAB — BASIC METABOLIC PANEL
ANION GAP: 9 (ref 5–15)
BUN: 5 mg/dL — ABNORMAL LOW (ref 6–20)
CO2: 24 mmol/L (ref 22–32)
Calcium: 8.4 mg/dL — ABNORMAL LOW (ref 8.9–10.3)
Chloride: 109 mmol/L (ref 98–111)
Creatinine, Ser: 0.76 mg/dL (ref 0.44–1.00)
GFR calc non Af Amer: >60 mL/min (ref >60)
Glucose, Bld: 90 mg/dL (ref 70–99)
Potassium: 3.9 mmol/L (ref 3.5–5.1)
Sodium: 142 mmol/L (ref 135–145)

## 2017-09-16 MED ORDER — DOXYCYCLINE HYCLATE 50 MG PO CAPS: 100.0000 mg | ORAL_CAPSULE | Freq: Two times a day (BID) | ORAL | 0 refills | Status: DC

## 2017-09-16 NOTE — Plan of Care (Signed)
  Problem: Education: Goal: Knowledge of General Education information will improve Description: Including pain rating scale, medication(s)/side effects and non-pharmacologic comfort measures Outcome: Progressing   Problem: Health Behavior/Discharge Planning: Goal: Ability to manage health-related needs will improve Outcome: Progressing   Problem: Clinical Measurements: Goal: Ability to maintain clinical measurements within normal limits will improve Outcome: Progressing Goal: Will remain free from infection Outcome: Progressing   Problem: Pain Managment: Goal: General experience of comfort will improve Outcome: Progressing   Problem: Safety: Goal: Ability to remain free from injury will improve Outcome: Progressing   

## 2017-09-16 NOTE — Discharge Summary (Addendum)
     Patient ID: Melissa [name] [id number] 1970-03-19 47 y.o.  Admit date: 09/14/2017 Discharge date: 09/16/2017  Admitting Diagnosis: Infected seroma, s/p lumpectomy  Discharge Diagnosis Patient Active Problem List   Diagnosis Date Noted  . S/P lumpectomy, right breast 09/14/2017  . Genetic testing 07/13/2017  . Family history of prostate cancer   . Malignant neoplasm of upper-inner quadrant of right breast in female, estrogen receptor positive ([place]) 07/06/2017    Consultants IR  Reason for Admission: Melissa [name] is an 47 y.o. female whom is a patient of my partner, Dr. Lucia Gaskins, who has a hx of right breast cancer s/p right breast lumpectomy + SLNBx 07/25/17. Path returned IDC, margins negative, SLNs negative. She is currently receiving adjuvant XRT. She ha developed a right axillary seroma a couple weeks ago and underwent aspiration in the office for this - she reports straw colored fluid aspirated. She did well and completed PT in the interim. Over the last week has had a slight fullness sensation in her right axilla and yesterday had a temp of 101.5. She noted redness to her right breast and axilla since starting XRT but nothing changed in terms of the redness recently. She denies any other complaints today.  Procedures Aspiration of infected seroma, Dr. Olivia Canter Course:  The patient was admitted and started on vancomycin.  IR was consulted for aspiration which was done.   CX are pending, but showing gram +.  Her WBC improved from 17 to 11.  She was otherwise stable for DC home on HD 2 with a total of 10 days of doxycycline.    Physical Exam: Gen: NAD Skin: decrease erythema.  No further fluctuance noted in right upper chest/axilla.  Allergies as of 09/16/2017   No Known Allergies     Medication List    TAKE these medications   acetaminophen 500 MG tablet Commonly known as:  TYLENOL Take 500 mg by mouth every 6 (six) hours as needed.   doxycycline 50 MG  capsule Commonly known as:  VIBRAMYCIN Take 2 capsules (100 mg total) by mouth 2 (two) times daily for 9 days.   fexofenadine 180 MG tablet Commonly known as:  ALLEGRA Take 180 mg by mouth daily as needed for allergies or rhinitis.   ibuprofen 200 MG tablet Commonly known as:  ADVIL,MOTRIN Take 200 mg by mouth every 6 (six) hours as needed for fever, headache, moderate pain or cramping.   naproxen sodium 220 MG tablet Commonly known as:  ALEVE Take 220 mg by mouth as needed.   RAPID GEL RX Gel Apply 1 application topically daily. Apply to right breast after radiation daily.        Follow-up Information    Alphonsa Overall, MD Follow up on 09/22/2017.   Specialty:  General Surgery Why:  8:30am, arrive by 8:15am for check in Contact information: [address] [place] [address] [phone]           Signed: Saverio Danker, [facility] Surgery 09/16/2017, 11:16 AM Pager: [phone]

## 2017-09-16 NOTE — Progress Notes (Signed)
Discharge home. Home discharge instruction given, no question verbalized. 

## 2017-09-19 ENCOUNTER — Ambulatory Visit: Payer: 59

## 2017-09-19 ENCOUNTER — Telehealth: Payer: Self-pay | Admitting: *Deleted

## 2017-09-19 NOTE — Telephone Encounter (Signed)
Pt called informing of admission to hospital for infection in axilla. Pt seeing PA at [facility] today 09/19/17 at 3pm to f/u after admission Informed pt we will cancel appt with Dr. [name] and r/s closer to end of xrt. Received verbal understanding. Denies further questions or needs.

## 2017-09-20 ENCOUNTER — Telehealth: Payer: Self-pay

## 2017-09-20 ENCOUNTER — Ambulatory Visit: Payer: 59

## 2017-09-20 LAB — AEROBIC/ANAEROBIC CULTURE (SURGICAL/DEEP WOUND)

## 2017-09-20 LAB — AEROBIC/ANAEROBIC CULTURE W GRAM STAIN (SURGICAL/DEEP WOUND)

## 2017-09-20 NOTE — Telephone Encounter (Signed)
Melissa [name] called me today to inform me that she has been diagnosed with a staph infection of her right breast. She is currently on antibiotics and being followed closely by her surgeons office. She reports that she is continuing to have fevers on and off. I have discussed the phone call with Dr. Sondra Come who is covering for Dr. Isidore Moos this week. He would like [place] to hold her breast radiation for the rest of the week, and have Dr. Isidore Moos evaluate her on Monday before her scheduled radiation. I have called and informed Ms. Guyett of this decision and she is in agreement.

## 2017-09-21 ENCOUNTER — Other Ambulatory Visit: Payer: Self-pay | Admitting: General Surgery

## 2017-09-21 ENCOUNTER — Encounter: Payer: Self-pay | Admitting: *Deleted

## 2017-09-21 ENCOUNTER — Ambulatory Visit: Payer: 59

## 2017-09-21 ENCOUNTER — Other Ambulatory Visit: Payer: Self-pay

## 2017-09-21 DIAGNOSIS — N6489 Other specified disorders of breast: Secondary | ICD-10-CM

## 2017-09-22 ENCOUNTER — Encounter (HOSPITAL_COMMUNITY): Payer: Self-pay | Admitting: General Practice

## 2017-09-22 ENCOUNTER — Ambulatory Visit
Admission: RE | Admit: 2017-09-22 | Discharge: 2017-09-22 | Disposition: A | Discharge status: Home / Self Care | Payer: 59 | Source: Ambulatory Visit | Attending: General Surgery | Admitting: General Surgery

## 2017-09-22 ENCOUNTER — Other Ambulatory Visit: Payer: Self-pay

## 2017-09-22 ENCOUNTER — Inpatient Hospital Stay (HOSPITAL_COMMUNITY)
Admission: AD | Admit: 2017-09-22 | Discharge: 2017-09-26 | DRG: 603 | Disposition: A | Discharge status: Home Health | Payer: 59 | Source: Ambulatory Visit | Attending: Surgery | Admitting: Surgery

## 2017-09-22 ENCOUNTER — Other Ambulatory Visit: Payer: Self-pay | Admitting: Surgery

## 2017-09-22 ENCOUNTER — Ambulatory Visit: Payer: 59

## 2017-09-22 ENCOUNTER — Inpatient Hospital Stay: Payer: Self-pay

## 2017-09-22 DIAGNOSIS — Z8262 Family history of osteoporosis: Secondary | ICD-10-CM | POA: Diagnosis not present

## 2017-09-22 DIAGNOSIS — Z833 Family history of diabetes mellitus: Secondary | ICD-10-CM

## 2017-09-22 DIAGNOSIS — N6489 Other specified disorders of breast: Secondary | ICD-10-CM

## 2017-09-22 DIAGNOSIS — Z801 Family history of malignant neoplasm of trachea, bronchus and lung: Secondary | ICD-10-CM | POA: Diagnosis not present

## 2017-09-22 DIAGNOSIS — R509 Fever, unspecified: Secondary | ICD-10-CM

## 2017-09-22 DIAGNOSIS — Z9049 Acquired absence of other specified parts of digestive tract: Secondary | ICD-10-CM

## 2017-09-22 DIAGNOSIS — C50911 Malignant neoplasm of unspecified site of right female breast: Secondary | ICD-10-CM | POA: Diagnosis present

## 2017-09-22 DIAGNOSIS — Z79899 Other long term (current) drug therapy: Secondary | ICD-10-CM

## 2017-09-22 DIAGNOSIS — L03111 Cellulitis of right axilla: Principal | ICD-10-CM | POA: Diagnosis present

## 2017-09-22 DIAGNOSIS — Z170 Estrogen receptor positive status [ER+]: Secondary | ICD-10-CM | POA: Diagnosis not present

## 2017-09-22 DIAGNOSIS — N611 Abscess of the breast and nipple: Secondary | ICD-10-CM

## 2017-09-22 DIAGNOSIS — Z841 Family history of disorders of kidney and ureter: Secondary | ICD-10-CM

## 2017-09-22 DIAGNOSIS — Z8249 Family history of ischemic heart disease and other diseases of the circulatory system: Secondary | ICD-10-CM | POA: Diagnosis not present

## 2017-09-22 DIAGNOSIS — Z8042 Family history of malignant neoplasm of prostate: Secondary | ICD-10-CM

## 2017-09-22 DIAGNOSIS — Z800 Family history of malignant neoplasm of digestive organs: Secondary | ICD-10-CM | POA: Diagnosis not present

## 2017-09-22 DIAGNOSIS — Z791 Long term (current) use of non-steroidal anti-inflammatories (NSAID): Secondary | ICD-10-CM | POA: Diagnosis not present

## 2017-09-22 DIAGNOSIS — L0390 Cellulitis, unspecified: Secondary | ICD-10-CM | POA: Diagnosis present

## 2017-09-22 LAB — CBC WITH DIFFERENTIAL/PLATELET
ABS IMMATURE GRANULOCYTES: 0.2 10*3/uL — AB (ref 0.0–0.1)
BASOS PCT: 0 %
Basophils Absolute: 0.1 10*3/uL (ref 0.0–0.1)
Eosinophils Absolute: 0.1 10*3/uL (ref 0.0–0.7)
Eosinophils Relative: 1 %
HCT: 34.8 % — ABNORMAL LOW (ref 36.0–46.0)
HEMOGLOBIN: 11 g/dL — AB (ref 12.0–15.0)
IMMATURE GRANULOCYTES: 1 %
LYMPHS ABS: 1.7 10*3/uL (ref 0.7–4.0)
LYMPHS PCT: 11 %
MCH: 27.7 pg (ref 26.0–34.0)
MCHC: 31.6 g/dL (ref 30.0–36.0)
MCV: 87.7 fL (ref 78.0–100.0)
MONO ABS: 0.6 10*3/uL (ref 0.1–1.0)
MONOS PCT: 4 %
NEUTROS ABS: 13.3 10*3/uL — AB (ref 1.7–7.7)
NEUTROS PCT: 83 %
PLATELETS: 304 10*3/uL (ref 150–400)
RBC: 3.97 MIL/uL (ref 3.87–5.11)
RDW: 13.7 % (ref 11.5–15.5)
WBC: 15.9 10*3/uL — ABNORMAL HIGH (ref 4.0–10.5)

## 2017-09-22 LAB — MRSA PCR SCREENING: MRSA by PCR: NEGATIVE

## 2017-09-22 MED ORDER — ACETAMINOPHEN 650 MG RE SUPP: 650.0000 mg | Freq: Four times a day (QID) | RECTAL | Status: DC | PRN

## 2017-09-22 MED ORDER — VANCOMYCIN HCL IN DEXTROSE 1-5 GM/200ML-% IV SOLN
1000.0000 mg | Freq: Two times a day (BID) | INTRAVENOUS | Status: DC
  Administered 2017-09-22: 1000 mg via INTRAVENOUS
  Filled 2017-09-22: qty 200

## 2017-09-22 MED ORDER — CEFAZOLIN SODIUM-DEXTROSE 2-4 GM/100ML-% IV SOLN
2.0000 g | Freq: Three times a day (TID) | INTRAVENOUS | Status: DC
  Administered 2017-09-22 – 2017-09-26 (×12): 2 g via INTRAVENOUS
  Filled 2017-09-22 (×13): qty 100

## 2017-09-22 MED ORDER — IBUPROFEN 600 MG PO TABS
600.0000 mg | ORAL_TABLET | Freq: Four times a day (QID) | ORAL | Status: DC | PRN
  Administered 2017-09-22 – 2017-09-26 (×8): 600 mg via ORAL
  Filled 2017-09-22 (×8): qty 1

## 2017-09-22 MED ORDER — ACETAMINOPHEN 325 MG PO TABS
650.0000 mg | ORAL_TABLET | Freq: Four times a day (QID) | ORAL | Status: DC | PRN
  Administered 2017-09-22 – 2017-09-25 (×8): 650 mg via ORAL
  Filled 2017-09-22 (×8): qty 2

## 2017-09-22 MED ORDER — POTASSIUM CHLORIDE IN NACL 20-0.45 MEQ/L-% IV SOLN
INTRAVENOUS | Status: DC
  Administered 2017-09-22 – 2017-09-26 (×6): via INTRAVENOUS
  Filled 2017-09-22 (×6): qty 1000

## 2017-09-22 MED ORDER — TRAMADOL HCL 50 MG PO TABS: 50.0000 mg | ORAL_TABLET | Freq: Four times a day (QID) | ORAL | Status: DC | PRN

## 2017-09-22 NOTE — Consult Note (Signed)
[facility] for Infectious Disease  Total days of antibiotics (8)       Reason for Consult: cellulitis, right breast/failed oral abtx    Referring Physician: newman  Principal Problem:   Cellulitis    HPI: Melissa [name] is a 47 y.o. female with history of breast ca, stage 1, ER +, s/p lumpectomy and sentinal node biopsy in early June did well initially with healing but developed seroma that required aspiration in the office in early July. She states that she subsequently started radiation and she noticed towards the end of the month having erythema, redness to the same area of her axilla as prior seroma. She started to have flu like illness with fever of 101+ on evening of 7/31. She was subsequently admitted to the hospital on 8/1 for cellulitis. She had fluid collection of 5cm, undergone aspiration. cx grew pan sensitive staph epi. She clinically improved on vancomycin then discharged on doxy where she reports still having fevers up to 100.6 while on antipyretics. She felt that the involved area of her right axilla continued to be erythematous and tender without improvement thus she was seen again in the surgery office. They were unsuccessful at aspirating any further fluid and at that time changed abtx to clindamycin. In the past 3 days she continued to have worsening spread of her erythema and tendernes spreading to posterior axially line. In addition, still indurated superior aspect of original affected area. Dr Jacinto Reap arranged for repeat imaging to see if any further aspiration/ or I x D needed in addition to admit her for failure to oral abtx. She and her husband are concerned that oral abtx have not worked. She has finished roughly 12 radiation of 20 cycles. Last session was on 7/30 -stopped due to infectious workup/tx.  She reports no sick contacts, though her pet dog has gi illness. No recent travel. No recent water exposure such as pools, hot tubs.  Prior studies include: 8/1:  Ultrasound of the RIGHT axilla performed demonstrating a 5.1 x 5.8 x 5.8 cm fluid collection in the RIGHT axilla. No other abnormalities in the RIGHT axilla identified.  8/1 cx:  Staphylococcus species (coagulase negative)    MIC    CIPROFLOXACIN <=0.5 SENSI... Sensitive    CLINDAMYCIN <=0.25 SENS... Sensitive    ERYTHROMYCIN <=0.25 SENS... Sensitive    GENTAMICIN <=0.5 SENSI... Sensitive    Inducible Clindamycin NEGATIVE  Sensitive    OXACILLIN <=0.25 SENS... Sensitive    RIFAMPIN <=0.5 SENSI... Sensitive    TETRACYCLINE <=1 SENSITIVE  Sensitive    TRIMETH/SULFA <=10 SENSIT... Sensitive    VANCOMYCIN <=0.5 SENSI... Sensitive       Past Medical History:  Diagnosis Date  . Cancer ([place]) 06/2017   right breast cancer  . Family history of prostate cancer     Allergies: No Known Allergies   Social History   Tobacco Use  . Smoking status: Never Smoker  . Smokeless tobacco: Never Used  Substance Use Topics  . Alcohol use: Yes    Alcohol/week: 1.0 standard drinks    Types: 1 Glasses of wine per week  . Drug use: Never    Family History  Problem Relation Age of Onset  . Heart attack Mother 56       d. 47  . Diabetes Mother   . Hypertension Mother   . Prostate cancer Father   . Diabetes Father   . Kidney failure Father   . Hypertension Father   . Lung cancer Paternal  Uncle        heavy smoker  . Arthritis Paternal Grandmother   . Osteoporosis Paternal Grandmother   . Diabetes Paternal Grandfather   . Kidney failure Paternal Grandfather   . Cancer Brother        stomach/colon cancer, d. 52; smoker     Review of Systems  Constitutional: positive for fever, chills, diaphoresis, activity change, appetite change, fatigue and unexpected weight change.  HENT: Negative for congestion, sore throat, rhinorrhea, sneezing, trouble swallowing and sinus pressure.  Eyes: Negative for photophobia and visual disturbance.  Respiratory: Negative for cough, chest tightness,  shortness of breath, wheezing and stridor.  Cardiovascular: Negative for chest pain, palpitations and leg swelling.  Gastrointestinal: Negative for nausea, vomiting, abdominal pain, diarrhea, constipation, blood in stool, abdominal distention and anal bleeding.  Genitourinary: Negative for dysuria, hematuria, flank pain and difficulty urinating.  Musculoskeletal: Negative for myalgias, back pain, joint swelling, arthralgias and gait problem.  Skin: positive right breast axillary tenderness Neurological: Negative for dizziness, tremors, weakness and light-headedness.  Hematological: Negative for adenopathy. Does not bruise/bleed easily.  Psychiatric/Behavioral: Negative for behavioral problems, confusion, sleep disturbance, dysphoric mood, decreased concentration and agitation.     OBJECTIVE: Temp:  [98.3 F (36.8 C)-98.7 F (37.1 C)] 98.7 F (37.1 C) (08/08 1426) Pulse Rate:  [95-96] 95 (08/08 1426) Resp:  [17-18] 17 (08/08 1426) BP: (126-128)/(79-85) 128/85 (08/08 1426) SpO2:  [100 %] 100 % (08/08 1426) Weight:  [83.7 kg] 83.7 kg (08/08 1200) Physical Exam  Constitutional:  oriented to person, place, and time. appears well-developed and well-nourished. No distress.  HENT: [place]/AT, PERRLA, no scleral icterus Mouth/Throat: Oropharynx is clear and moist. No oropharyngeal exudate.  Cardiovascular: Normal rate, regular rhythm and normal heart sounds. Exam reveals no gallop and no friction rub.  No murmur heard.  Pulmonary/Chest: Effort normal and breath sounds normal. No respiratory distress.  has no wheezes.  Neck = supple, no nuchal rigidity Chest wall: has incision-well healed at 2 oclock at right breast with radiomarker in place. At roughly 9 and 10 oclock it is indurated, mild bruising, warmth extending towards nipple, as well as posteriorly torwards posterior axillary line.no open lesions or spontaneous drainage Abdominal: Soft. Bowel sounds are normal.  exhibits no distension. There  is no tenderness.  Lymphadenopathy: no cervical adenopathy. No axillary adenopathy Neurological: alert and oriented to person, place, and time.  Skin: Skin is warm and dry. No rash noted. No erythema.  Psychiatric: a normal mood and affect.  behavior is normal.    LABS: Results for orders placed or performed during the hospital encounter of 09/22/17 (from the past 48 hour(s))  CBC WITH DIFFERENTIAL     Status: Abnormal   Collection Time: 09/22/17 12:57 PM  Result Value Ref Range   WBC 15.9 (H) 4.0 - 10.5 K/uL   RBC 3.97 3.87 - 5.11 MIL/uL   Hemoglobin 11.0 (L) 12.0 - 15.0 g/dL   HCT 34.8 (L) 36.0 - 46.0 %   MCV 87.7 78.0 - 100.0 fL   MCH 27.7 26.0 - 34.0 pg   MCHC 31.6 30.0 - 36.0 g/dL   RDW 13.7 11.5 - 15.5 %   Platelets 304 150 - 400 K/uL   Neutrophils Relative % 83 %   Neutro Abs 13.3 (H) 1.7 - 7.7 K/uL   Lymphocytes Relative 11 %   Lymphs Abs 1.7 0.7 - 4.0 K/uL   Monocytes Relative 4 %   Monocytes Absolute 0.6 0.1 - 1.0 K/uL   Eosinophils Relative 1 %  Eosinophils Absolute 0.1 0.0 - 0.7 K/uL   Basophils Relative 0 %   Basophils Absolute 0.1 0.0 - 0.1 K/uL   Immature Granulocytes 1 %   Abs Immature Granulocytes 0.2 (H) 0.0 - 0.1 K/uL    Comment: Performed at [facility] [address] [place], [place] [address]    MICRO: Reviewed. IMAGING: [place] Ekg Site Rite  Result Date: 09/22/2017 If Site Rite image not attached, placement could not be confirmed due to current cardiac rhythm.   Assessment/Plan:  47yo F with stage 1 breast ca s/p lumpectomy and LN excision c/b seroma roughly 2 weeks post-op. But roughly at 6 wk post op had bout of cellulitis to right breast with CoNS infection that has not responded to oral abtx.  Cellulitis of right breast = it is usually caused by staph or strep species. It does not have purulent features that are associated with MRSA. If it were another species that has not been isolated yet, such as strep, - it may explain why doxycycline  did not work. For now, I  recommend to treat with cefazolin 2gm IV Q 8hr to see if can get faster drug concentration as opposed to vancomycin. The isolate is sensitive to oxacillin so cefazolin should work adequately.  Fevers = she has had fevers associated with her cellulitis, which makes me concerned for bacteremia. Will order blood cx (realizing she is a difficult venous access - will still get the cx from new picc line - in theory should be sterile)  Breast ca = agree with postponing radiation until resolution of cellulitis

## 2017-09-22 NOTE — Progress Notes (Signed)
Assessed Left arm with ultrasound and not able to find any suitable vein for PIV.  Two IVRN unable to obtain IV access.

## 2017-09-22 NOTE — Progress Notes (Signed)
Just spoke with IV team who already got one culture from the PICC line and due to PT being difficult stick they can't attempt a second culture from another site. Will inform the Dr and go from there on how to proceed.

## 2017-09-22 NOTE — Progress Notes (Signed)
Spoke with Dr Barry Dienes and she confirmed it was ok to use PICC with one culture d/t patient being a difficult stick and inability to obtain second culture from a different spot. Will proceed with antibiotics and using PICC line

## 2017-09-22 NOTE — H&P (Addendum)
Melissa [name]  Location: [facility] Patient #: [id number] DOB: 1970-04-27 Married / Language: English / Race: White Female  History of Present Illness   The patient is a 47 year old female who presents with a complaint of breast cancer.  The PCP is Dr. Aviva Signs  The pateint was seen at the [facility] - Oncology is Drs. [name] and Isidore Moos She comes with her husband.  She and her husband are fairly anxious today. Right breast lumpectomy and right axillary SLNBx - 07/25/2017 - D. Aniylah Avans.  She's had problems with a seroma that got infected. This required hospitalization from 09/14/2017 - 09/16/2017. She was given vancomycin for 2 days on which her WBC improved. 60 cc were aspirated. Cultures were staph species, pan sensitive. She was on doxycycline from 09/16/2017 to 09/19/2017, then switched to clindamycin on 09/19/2017.  But she continues to run a fever at home, feells worse, and thinks that the cellulitis is worse. She has had 12 treatments of her rad tx. She is due for 8 more. I left a message with Dr. Isidore Moos about her admission.          She had an [place] at the [facility] this AM.  There is no report in the computer yet, but I spoke with Dr. Alejandro Mulling who saw no fluid to aspirate or culture.  Past Medical History: 1. Right breast cancer Right breast biopsy at 2 o'clock on 06/29/2017 [phone]) shows IDC, grad 1, ER - 70%, PR - 100%, Ki67 - 5%, Her2 - Negative Right breast lumpectomy and right axillary SLNBx - 07/25/2017 - D. Eero Dini. Her pathology [phone]) - 1.0 cm IDC, grade 2, 0/2 nodes Oncotype - 16, recurrence rate - 4% She has had 12/20 radiation treatments. Dr. Isidore Moos is her rad oncologists.  2. Lap chole - 2010 3. C Section 2006  Social History: Married. Husband [name]. He works with EP doctors. She does not work. She has one son, 57 yo  Allergies (April Staton, [place]; 09/22/2017 8:34  AM) No Known Drug Allergies [08/10/2017]:  Medication History (April Staton, CMA; 09/22/2017 8:35 AM) Clindamycin HCl (300MG Capsule, 1 (one) Oral three times daily, Taken starting 09/19/2017) Active. Doxycycline Hyclate (50MG Capsule, Oral) Active. Aleve (220MG Tablet, Oral) Active. Tylenol (325MG Tablet, Oral) Active. Medications Reconciled  Vitals (April Staton CMA; 09/22/2017 8:35 AM) 09/22/2017 8:35 AM Weight: 184.5 lb Height: 65in Body Surface Area: 1.91 m Body Mass Index: 30.7 kg/m  Temp.: 98.43F(Oral)  Pulse: 112 (Regular)  BP: 130/90 (Sitting, Left Arm, Standard)   Physical Exam  General: WN WF alert. She looks like she does not feel good. Skin: Inspection and palpation of the skin unremarkable.  Eyes: Conjunctivae white, pupils equal. Face, ears, nose, mouth, and throat: Face - normal. Normal ears and nose. Lips and teeth normal.  Neck: Supple. No mass. Trachea midline. No thyroid mass.  Lymph Nodes: No supraclavicular or cervical adenopathy. Right axilla - redness and tenderness that extends about 5 cm from the incision. There is no evidence of pus or abscess to drain.  Breast: Right - Scar in UIQ of right breast looks good Left - no mass or nodule  Musculoskeletal/extremities: Good strength and ROM in upper and lower extremities. But numbness in the upper inner arm.   Assessment & Plan  1.  MALIGNANT NEOPLASM OF RIGHT BREAST, STAGE 1, ESTROGEN RECEPTOR POSITIVE (C50.911)  Story: Right breast biopsy at 2 o'clock on 06/29/2017 ([id number]) shows IDC, grad 1, ER - 70%, PR - 100%,  Ki67 - 5%, Her2 - Negative  Right breast lumpectomy and right axillary SLNBx - 07/25/2017 - D. Izadora Roehr. Her pathology [id number]) - 1.0 cm IDC, grade 2, 0/2 nodes    Oncology - [place]  2.  CELLULITIS OF AXILLA, RIGHT (L03.111)  Plan  1) To admit for IV antibiotics  She wants to go to [facility]   2) Will discuss with ID.  Alphonsa Overall, MD,  [facility] Pager: [phone] Office phone:  [phone]

## 2017-09-22 NOTE — Progress Notes (Signed)
[facility] Infusion Coordinator will follow pt with ID team once consulted to support home infusion pharmacy services at DC if needed.  If patient discharges after hours, please call [phone].   Larry Sierras 09/22/2017, 2:46 PM

## 2017-09-22 NOTE — Progress Notes (Signed)
ANTIBIOTIC CONSULT NOTE - INITIAL  Pharmacy Consult for Vancomycin Indication: cellulitis  No Known Allergies  Patient Measurements:   Adjusted Body Weight:    Vital Signs:   Intake/Output from previous day: No intake/output data recorded. Intake/Output from this shift: No intake/output data recorded.  Labs: No results for input(s): WBC, HGB, PLT, LABCREA, CREATININE in the last 72 hours. Estimated Creatinine Clearance: 88 mL/min (by C-G formula based on SCr of 0.76 mg/dL). No results for input(s): VANCOTROUGH, VANCOPEAK, VANCORANDOM, GENTTROUGH, GENTPEAK, GENTRANDOM, TOBRATROUGH, TOBRAPEAK, TOBRARND, AMIKACINPEAK, AMIKACINTROU, AMIKACIN in the last 72 hours.   Microbiology:   Medical History: Past Medical History:  Diagnosis Date  . Cancer ([place]) 06/2017   right breast cancer  . Family history of prostate cancer     Assessment: CC/HPI: R breast staph infection on abx PTA but having fevers at home.  PMH: breast radiation for breast cancer s/p R lumpectomy.  Significant events: Recent hospital discharge 8/2 for infected seroma, s/p lumpectomy. 8/1 culture was CNS. Pt was on Vancomycin 7/31-8/2. Discharged home on doxycycline  ID: R axillary cellulitis. WBC 11.8 slightly elevated. Scr WNL -  8/1 culture was CNS. Pt was on Vancomycin 7/31-8/2. Discharged home on doxycycline  Vancomycin 8/8>>  Goal of Therapy:  Vancomycin trough level 10-15 mcg/ml  Plan:  Vancomycin 1g IV q 12 hrs Trough after 3-5 doses at steady state.  Jolean Madariaga S. Alford Highland, PharmD, BCPS Clinical Staff Pharmacist Pager [phone]  Eilene Ghazi Stillinger 09/22/2017,12:57 PM

## 2017-09-22 NOTE — Progress Notes (Signed)
[facility] will provide home infusion pharmacy services and home IV ABX at DC.  [facility] will also provide HHRN to support IV ABX administration/education, PICC care and weekly labs.  If patient discharges after hours, please call [phone].   Larry Sierras 09/22/2017, 3:12 PM

## 2017-09-23 ENCOUNTER — Inpatient Hospital Stay: Payer: 59 | Admitting: Hematology and Oncology

## 2017-09-23 ENCOUNTER — Ambulatory Visit: Payer: 59

## 2017-09-23 LAB — CBC WITH DIFFERENTIAL/PLATELET
ABS IMMATURE GRANULOCYTES: 0.1 10*3/uL (ref 0.0–0.1)
BASOS ABS: 0.1 10*3/uL (ref 0.0–0.1)
BASOS PCT: 0 %
Eosinophils Absolute: 0.1 10*3/uL (ref 0.0–0.7)
Eosinophils Relative: 1 %
HCT: 33.3 % — ABNORMAL LOW (ref 36.0–46.0)
HEMOGLOBIN: 10.4 g/dL — AB (ref 12.0–15.0)
Immature Granulocytes: 1 %
LYMPHS PCT: 11 %
Lymphs Abs: 1.6 10*3/uL (ref 0.7–4.0)
MCH: 27.8 pg (ref 26.0–34.0)
MCHC: 31.2 g/dL (ref 30.0–36.0)
MCV: 89 fL (ref 78.0–100.0)
MONO ABS: 0.7 10*3/uL (ref 0.1–1.0)
MONOS PCT: 5 %
NEUTROS ABS: 11.4 10*3/uL — AB (ref 1.7–7.7)
Neutrophils Relative %: 82 %
PLATELETS: 300 10*3/uL (ref 150–400)
RBC: 3.74 MIL/uL — ABNORMAL LOW (ref 3.87–5.11)
RDW: 13.9 % (ref 11.5–15.5)
WBC: 14 10*3/uL — ABNORMAL HIGH (ref 4.0–10.5)

## 2017-09-23 MED ORDER — SODIUM CHLORIDE 0.9% FLUSH
10.0000 mL | INTRAVENOUS | Status: DC | PRN
Start: 2017-09-23 — End: 2017-09-26
  Administered 2017-09-26: 10 mL
  Filled 2017-09-23: qty 40

## 2017-09-23 NOTE — Care Management Note (Signed)
Case Management Note  Patient Details  Name: Melissa [name] MRN: [id number] Date of Birth: 08/09/1970  Subjective/Objective:                    Action/Plan:  Spoke to patient at bedside. Patient from home with husband. Will continue to follow to see if she needs home IV ABX. Patient prefers [facility] for infusion and home health agency . If patient discharges home with IV ABX she is aware and agreeable to her and her husband being taught how to administer ABX with [facility] for support. Expected Discharge Date:                  Expected Discharge Plan:  [place]  In-House Referral:     Discharge planning Services  CM Consult  Post Acute Care Choice:    Choice offered to:  Patient  DME Arranged:  N/A DME Agency:  NA  HH Arranged:    [place] Agency:  [facility]  Status of Service:  In process, will continue to follow  If discussed at Long Length of Stay Meetings, dates discussed:    Additional Comments:  Marilu Favre, RN 09/23/2017, 11:14 AM

## 2017-09-23 NOTE — Progress Notes (Signed)
    [facility] for Infectious Disease    Date of Admission:  09/22/2017   Total days of antibiotics 2        Day 2 cefazolin   ID: Melissa [name] is a 47 y.o. female with left breast/axillary cellulitis Principal Problem:   Cellulitis    Subjective: Afebrile, less discomfort on left affected breast   Objective: Vital signs in last 24 hours: Temp:  [98 F (36.7 C)-98.8 F (37.1 C)] 98 F (36.7 C) (08/09 1325) Pulse Rate:  [80-89] 89 (08/09 1325) Resp:  [16-18] 18 (08/09 1325) BP: (122-170)/(74-112) 170/112 (08/09 1325) SpO2:  [94 %-100 %] 100 % (08/09 1325) Physical Exam  Constitutional:  oriented to person, place, and time. appears well-developed and well-nourished. No distress.  HENT: [place]/AT, PERRLA, no scleral icterus Mouth/Throat: Oropharynx is clear and moist. No oropharyngeal exudate.  Cardiovascular: Normal rate, regular rhythm and normal heart sounds. Exam reveals no gallop and no friction rub.  No murmur heard.  Pulmonary/Chest: Effort normal and breath sounds normal. No respiratory distress.  has no wheezes.  Neck = supple, no nuchal rigidity Skin: less erythema, but slightly more induration than yesterday Psychiatric: a normal mood and affect.  behavior is normal.    Lab Results Recent Labs    09/22/17 1257 09/23/17 0428  WBC 15.9* 14.0*  HGB 11.0* 10.4*  HCT 34.8* 33.3*    Microbiology: Blood cx ngtd Studies/Results: [place] Axilla Right  Result Date: 09/22/2017 CLINICAL DATA:  Patient presents for reassessment an area of right breast infection. She was recently hospitalized for her breast infection receiving IV vancomycin therapy. She also underwent abscess drainage while in the hospital. Her symptoms improved, but since discharge, her symptoms have worsened with increased redness, swelling and pain. The symptoms are in the upper outer right breast and right anterior, inferior axilla. EXAM: ULTRASOUND OF THE RIGHT BREAST/AXILLA COMPARISON:  Previous  exam(s). FINDINGS: On physical exam, the upper-outer quadrant of the right breast extending to the anterior inferior right axilla is swollen, erythematous, warm and firm as well is tender to palpation. No discrete mass. Targeted ultrasound is performed, showing increased echogenicity, consistent with inflammation, of the tissue throughout the upper outer right breast extending to the right axilla with fluid tracking between lobules inflamed fat. However, there is no defined fluid collection. There are no discrete masses. IMPRESSION: 1. Recurrent/persistent right breast infection without a defined, drainable abscess. RECOMMENDATION: Surgical follow-up. The patient will be seeing her breast surgeon this morning. I have discussed the findings and recommendations with the patient. Results were also provided in writing at the conclusion of the visit. If applicable, a reminder letter will be sent to the patient regarding the next appointment. BI-RADS CATEGORY  2: Benign. Electronically Signed   By: Lajean Manes M.D.   On: 09/22/2017 08:56   [place] Ekg Site Rite  Result Date: 09/22/2017 If Site Rite image not attached, placement could not be confirmed due to current cardiac rhythm.    Assessment/Plan: Continue with IV cefazolin 2gm IV Q 8hr since it appears improving. Will re-evaluate over the weekend to see if need to repeat ultrasound to see if any organized fluid collection needing to drain. For now would keep on iv since she had failed oral abtx/inadequate streptococcal coverage.  [facility] for Infectious Diseases Cell: [phone] Pager: [phone]  09/23/2017, 5:57 PM

## 2017-09-23 NOTE — Progress Notes (Signed)
[facility] Office:  [phone] General Surgery Progress Note   LOS: 1 day  POD -     Chief Complaint: Right axillary cellulitis  Assessment and Plan: 1.  Right axillary cellulitis  On IV Ancef.  [place] on 09/22/2017 showed no drainable collection.  WBC better at 14,000  Feels better today.  Being followed by ID, Dr. Retia Passe.  2.  Right breast cancer Right breast lumpectomy and right axillary SLNBx - 07/25/2017 - D. Jamee Pacholski. Her pathology [id number]) - 1.0 cm IDC, grade 2, 0/2 nodes Oncotype - 16, recurrence rate - 4% She has had 12/20 radiation treatments. Dr. Isidore Moos is her rad oncologists.  Her rad tx has been on hold because of the right axillary cellulitis for the last week.   Principal Problem:   Cellulitis  Subjective:  Feels better today.  But still tender right axilla.  Objective:   Vitals:   09/23/17 0446 09/23/17 1325  BP: (!) 142/78 (!) 170/112  Pulse: 84 89  Resp: 16 18  Temp: 98.3 F (36.8 C) 98 F (36.7 C)  SpO2: 97% 100%     Intake/Output from previous day:  08/08 0701 - 08/09 0700 In: 163.1 [P.O.:120; I.V.:43.1] Out: -   Intake/Output this shift:  No intake/output data recorded.   Physical Exam:   General: WN WF who is alert and oriented.    HEENT: Normal. Pupils equal. .   Lungs: Clear   Right axilla - superficial redness extends to about 5 cm above the incision and 2 cm below the incision.  The area below the incision looks better than yesterday.   Lab Results:    Recent Labs    09/22/17 1257 09/23/17 0428  WBC 15.9* 14.0*  HGB 11.0* 10.4*  HCT 34.8* 33.3*  PLT 304 300    BMET  No results for input(s): NA, K, CL, CO2, GLUCOSE, BUN, CREATININE, CALCIUM in the last 72 hours.  PT/INR  No results for input(s): LABPROT, INR in the last 72 hours.  ABG  No results for input(s): PHART, HCO3 in the last 72 hours.  Invalid input(s): PCO2, PO2   Studies/Results:  [place] Axilla Right  Result Date:  09/22/2017 CLINICAL DATA:  Patient presents for reassessment an area of right breast infection. She was recently hospitalized for her breast infection receiving IV vancomycin therapy. She also underwent abscess drainage while in the hospital. Her symptoms improved, but since discharge, her symptoms have worsened with increased redness, swelling and pain. The symptoms are in the upper outer right breast and right anterior, inferior axilla. EXAM: ULTRASOUND OF THE RIGHT BREAST/AXILLA COMPARISON:  Previous exam(s). FINDINGS: On physical exam, the upper-outer quadrant of the right breast extending to the anterior inferior right axilla is swollen, erythematous, warm and firm as well is tender to palpation. No discrete mass. Targeted ultrasound is performed, showing increased echogenicity, consistent with inflammation, of the tissue throughout the upper outer right breast extending to the right axilla with fluid tracking between lobules inflamed fat. However, there is no defined fluid collection. There are no discrete masses. IMPRESSION: 1. Recurrent/persistent right breast infection without a defined, drainable abscess. RECOMMENDATION: Surgical follow-up. The patient will be seeing her breast surgeon this morning. I have discussed the findings and recommendations with the patient. Results were also provided in writing at the conclusion of the visit. If applicable, a reminder letter will be sent to the patient regarding the next appointment. BI-RADS CATEGORY  2: Benign. Electronically Signed   By: Dedra Skeens.D.  On: 09/22/2017 08:56   [place] Ekg Site Rite  Result Date: 09/22/2017 If Site Rite image not attached, placement could not be confirmed due to current cardiac rhythm.    Anti-infectives:   Anti-infectives (From admission, onward)   Start     Dose/Rate Route Frequency Ordered Stop   09/22/17 1800  ceFAZolin (ANCEF) IVPB 2g/100 mL premix     2 g 200 mL/hr over 30 Minutes Intravenous Every 8 hours  09/22/17 1617     09/22/17 1400  vancomycin (VANCOCIN) IVPB 1000 mg/200 mL premix  Status:  Discontinued     1,000 mg 200 mL/hr over 60 Minutes Intravenous Every 12 hours 09/22/17 1259 09/22/17 1617      Alphonsa Overall, MD, FACS Pager: [facility] Office: [phone] 09/23/2017

## 2017-09-24 LAB — CBC WITH DIFFERENTIAL/PLATELET
ABS IMMATURE GRANULOCYTES: 0.2 10*3/uL — AB (ref 0.0–0.1)
BASOS ABS: 0.1 10*3/uL (ref 0.0–0.1)
Basophils Relative: 1 %
EOS PCT: 2 %
Eosinophils Absolute: 0.2 10*3/uL (ref 0.0–0.7)
HCT: 34.1 % — ABNORMAL LOW (ref 36.0–46.0)
HEMOGLOBIN: 10.5 g/dL — AB (ref 12.0–15.0)
Immature Granulocytes: 1 %
LYMPHS ABS: 1.6 10*3/uL (ref 0.7–4.0)
LYMPHS PCT: 13 %
MCH: 27.6 pg (ref 26.0–34.0)
MCHC: 30.8 g/dL (ref 30.0–36.0)
MCV: 89.5 fL (ref 78.0–100.0)
MONO ABS: 0.6 10*3/uL (ref 0.1–1.0)
Monocytes Relative: 5 %
Neutro Abs: 9.7 10*3/uL — ABNORMAL HIGH (ref 1.7–7.7)
Neutrophils Relative %: 78 %
Platelets: 323 10*3/uL (ref 150–400)
RBC: 3.81 MIL/uL — AB (ref 3.87–5.11)
RDW: 14 % (ref 11.5–15.5)
WBC: 12.3 10*3/uL — ABNORMAL HIGH (ref 4.0–10.5)

## 2017-09-24 NOTE — Progress Notes (Signed)
Subjective: Stable and alert.  Still has discomfort in right axilla but a little better. Afebrile.  Heart rate 77. Today, WBC down to 12,300.  Objective: Vital signs in last 24 hours: Temp:  [98 F (36.7 C)-98.5 F (36.9 C)] 98.3 F (36.8 C) (08/10 0453) Pulse Rate:  [77-89] 77 (08/10 0453) Resp:  [18] 18 (08/10 0453) BP: (122-170)/(69-112) 122/71 (08/10 0453) SpO2:  [97 %-100 %] 97 % (08/10 0453) Last BM Date: 09/22/17  Intake/Output from previous day: 08/09 0701 - 08/10 0700 In: 2892.3 [P.O.:120; I.V.:1150.7; IV Piggyback:1621.6] Out: -  Intake/Output this shift: No intake/output data recorded.  General appearance: Alert and oriented.  Husband in room.  Appears reasonably comfortable without any evidence of toxicity. Resp: clear to auscultation bilaterally Chest wall: no tenderness, Right axilla still has erythema and induration above and below the incision.  No fluctuance or obvious fluid collection.  Apparently this is slowly improving  Lab Results:  Recent Labs    09/23/17 0428 09/24/17 0359  WBC 14.0* 12.3*  HGB 10.4* 10.5*  HCT 33.3* 34.1*  PLT 300 323   BMET No results for input(s): NA, K, CL, CO2, GLUCOSE, BUN, CREATININE, CALCIUM in the last 72 hours. PT/INR No results for input(s): LABPROT, INR in the last 72 hours. ABG No results for input(s): PHART, HCO3 in the last 72 hours.  Invalid input(s): PCO2, PO2  Studies/Results: [place] Axilla Right  Result Date: 09/22/2017 CLINICAL DATA:  Patient presents for reassessment an area of right breast infection. She was recently hospitalized for her breast infection receiving IV vancomycin therapy. She also underwent abscess drainage while in the hospital. Her symptoms improved, but since discharge, her symptoms have worsened with increased redness, swelling and pain. The symptoms are in the upper outer right breast and right anterior, inferior axilla. EXAM: ULTRASOUND OF THE RIGHT BREAST/AXILLA COMPARISON:  Previous  exam(s). FINDINGS: On physical exam, the upper-outer quadrant of the right breast extending to the anterior inferior right axilla is swollen, erythematous, warm and firm as well is tender to palpation. No discrete mass. Targeted ultrasound is performed, showing increased echogenicity, consistent with inflammation, of the tissue throughout the upper outer right breast extending to the right axilla with fluid tracking between lobules inflamed fat. However, there is no defined fluid collection. There are no discrete masses. IMPRESSION: 1. Recurrent/persistent right breast infection without a defined, drainable abscess. RECOMMENDATION: Surgical follow-up. The patient will be seeing her breast surgeon this morning. I have discussed the findings and recommendations with the patient. Results were also provided in writing at the conclusion of the visit. If applicable, a reminder letter will be sent to the patient regarding the next appointment. BI-RADS CATEGORY  2: Benign. Electronically Signed   By: Melissa [name] M.D.   On: 09/22/2017 08:56   [place] Ekg Site Rite  Result Date: 09/22/2017 If Site Rite image not attached, placement could not be confirmed due to current cardiac rhythm.   Anti-infectives: Anti-infectives (From admission, onward)   Start     Dose/Rate Route Frequency Ordered Stop   09/22/17 1800  ceFAZolin (ANCEF) IVPB 2g/100 mL premix     2 g 200 mL/hr over 30 Minutes Intravenous Every 8 hours 09/22/17 1617     09/22/17 1400  vancomycin (VANCOCIN) IVPB 1000 mg/200 mL premix  Status:  Discontinued     1,000 mg 200 mL/hr over 60 Minutes Intravenous Every 12 hours 09/22/17 1259 09/22/17 1617        Assessment and Plan: 1.  Right axillary  cellulitis             On IV Ancef.  [place] on 09/22/2017 showed no drainable collection.             WBC better at 14,000             Feels better today.  Being followed by ID, Melissa [name].             If the cellulitis persists, consider repeat imaging with  ultrasound.  If no drainable fluid collection thereafter the only other option would simply be to open the wound in the OR and repeat cultures.               For the next day or 2, I think we should wait to see if she continues to respond to the Ancef  2.  Right breast cancer Right breast lumpectomy and right axillary SLNBx - 07/25/2017 - Melissa [name]. Her pathology [phone]) - 1.0 cm IDC, grade 2, 0/2 nodes Oncotype - 16, recurrence rate - 4% She has had 12/20 radiation treatments. Melissa [name] is her rad oncologists.  Her rad tx has been on hold because of the right axillary cellulitis for the last week.    LOS: 2 days    Melissa [name] 09/24/2017

## 2017-09-25 ENCOUNTER — Inpatient Hospital Stay (HOSPITAL_COMMUNITY): Payer: 59

## 2017-09-25 NOTE — Progress Notes (Signed)
PHARMACY CONSULT NOTE FOR:  OUTPATIENT  PARENTERAL ANTIBIOTIC THERAPY (OPAT)  Indication: Axillary cellulitis/SSTI  Regimen: Cefazolin 2g IV q8 x 14d total End date: 10/05/17  IV antibiotic discharge orders are pended. To discharging provider:  please sign these orders via discharge navigator,  Select New Orders & click on the button choice - Manage This Unsigned Work.     Thank you for allowing pharmacy to be a part of this patient's care.  Onnie Boer, PharmD, [place], AAHIVP, CPP Infectious Disease Pharmacist Pager: [phone] 09/25/2017 7:30 PM

## 2017-09-25 NOTE — Progress Notes (Addendum)
  Subjective: Feeling a little better.  A little less pain.  No wound drainage. Afebrile.  Heart rate 80. No labs today Objective: Vital signs in last 24 hours: Temp:  [97.9 F (36.6 C)-98.4 F (36.9 C)] 98.1 F (36.7 C) (08/11 0603) Pulse Rate:  [80-85] 80 (08/11 0603) Resp:  [18] 18 (08/11 0603) BP: (133-152)/(61-80) 133/79 (08/11 0603) SpO2:  [97 %-100 %] 99 % (08/11 0603) Last BM Date: 09/23/17  Intake/Output from previous day: 08/10 0701 - 08/11 0700 In: 1678.3 [P.O.:370; I.V.:1208.3; IV Piggyback:100] Out: -  Intake/Output this shift: No intake/output data recorded.   General appearance: Alert and oriented.  Appears reasonably comfortable without any evidence of toxicity. Resp: clear to auscultation bilaterally Chest wall: no tenderness, Right axilla still has erythema and induration more above above than below the incision.  No fluctuance or obvious fluid collection.    I think that the area of erythema and induration is less than yesterday.   Lab Results:  Recent Labs    09/23/17 0428 09/24/17 0359  WBC 14.0* 12.3*  HGB 10.4* 10.5*  HCT 33.3* 34.1*  PLT 300 323   BMET No results for input(s): NA, K, CL, CO2, GLUCOSE, BUN, CREATININE, CALCIUM in the last 72 hours. PT/INR No results for input(s): LABPROT, INR in the last 72 hours. ABG No results for input(s): PHART, HCO3 in the last 72 hours.  Invalid input(s): PCO2, PO2  Studies/Results: No results found.  Anti-infectives: Anti-infectives (From admission, onward)   Start     Dose/Rate Route Frequency Ordered Stop   09/22/17 1800  ceFAZolin (ANCEF) IVPB 2g/100 mL premix     2 g 200 mL/hr over 30 Minutes Intravenous Every 8 hours 09/22/17 1617     09/22/17 1400  vancomycin (VANCOCIN) IVPB 1000 mg/200 mL premix  Status:  Discontinued     1,000 mg 200 mL/hr over 60 Minutes Intravenous Every 12 hours 09/22/17 1259 09/22/17 1617      Assessment/Plan:   1. Right axillary cellulitis On  IV Ancef. [place] on 09/22/2017 showed no drainable collection Feels better today. Being followed by ID, Dr. Retia Passe.             I think the cellulitis looks better today             If the cellulitis persists, consider repeat imaging with ultrasound.  If no drainable fluid collection thereafter the only other option would simply be to open the wound in the OR and repeat cultures.   Since getting better this is clearly not indicated at this point              For the next day or 2, I think we should wait to see if she continues to respond to the Ancef               Consider discharge home on IV ancef since she has a PICC line.               Dr. Alphonsa Overall will resume her care tomorrow.  2.Right breast cancer Right breast lumpectomy and right axillary SLNBx - 07/25/2017 - D. Newman. Her pathology [phone]) - 1.0 cm IDC, grade 2, 0/2 nodes Oncotype - 16, recurrence rate - 4% She has had 12/20 radiation treatments. Dr. Isidore Moos is her rad oncologists. Her rad tx has been on hold because of the right axillary cellulitis for the last week.   LOS: 3 days    Adin Hector 09/25/2017

## 2017-09-25 NOTE — Progress Notes (Addendum)
    [facility]    Date of Admission:  09/22/2017   Total days of antibiotics 4        Day 4 cefazolin   ID: Melissa [name] is a 47 y.o. female with breast ca. Recurrent axillary cellulitis, failed oral regimen Principal Problem:   Cellulitis    Subjective: Afebrile, less pain swelling to right axillla  12 point ros is otherwise negative Objective: Vital signs in last 24 hours: Temp:  [98.1 F (36.7 C)-98.4 F (36.9 C)] 98.2 F (36.8 C) (08/11 1255) Pulse Rate:  [80-81] 80 (08/11 1255) Resp:  [18] 18 (08/11 0603) BP: (130-145)/(61-82) 130/82 (08/11 1255) SpO2:  [97 %-100 %] 100 % (08/11 1255) Physical Exam  Constitutional:  oriented to person, place, and time. appears well-developed and well-nourished. No distress.  HENT: [place]/AT, PERRLA, no scleral icterus Mouth/Throat: Oropharynx is clear and moist. No oropharyngeal exudate.  Cardiovascular: Normal rate, regular rhythm and normal heart sounds. Exam reveals no gallop and no friction rub.  No murmur heard.  Pulmonary/Chest: Effort normal and breath sounds normal. No respiratory distress.  has no wheezes.  Ext: left arm picc line is c/d/i Abdominal: Soft. Bowel sounds are normal.  exhibits no distension. There is no tenderness.  Skin: erythema localized about her original incision. Less swelling inferior to healed incision. Still indurated above the original incision. Erythema no longer extends to nipple nor posterior axillary line Psychiatric: mildly anxious  Lab Results Recent Labs    09/23/17 0428 09/24/17 0359  WBC 14.0* 12.3*  HGB 10.4* 10.5*  HCT 33.3* 34.1*    Microbiology: Blood cx ngtd Studies/Results: No results found.   Assessment/Plan: Axillary cellulitis/SSTI = recommend 14 days total (thus 10 more days of IV cefazolin) to take at home. [name] already taught how to do infusion   I will order repeat axillary ultrasound to see if any loculated fluid collection that  needs to be drained. Will defer to Dr Melissa [name] to whether needs aspiration or keep as is.  Leukocytosis = repeat cbc in the am  Will sign off. Can page me for further questions.  Will send in opat orders.  Diagnosis: SSTI  Culture Result: none  No Known Allergies  OPAT Orders Discharge antibiotics: Per pharmacy protocol  Cefazolin 2gm IV q8hr  Duration: 10 End Date: 8/21  [facility] Care Per Protocol:  Labs weekly while on IV antibiotics: _x_ CBC with differential _x_ BMP   _x Please pull PIC at completion of IV antibiotics   Fax weekly labs to [phone]  Clinic Follow Up Appt: In 7 days with dr Melissa [name]  @ [facility] for Infectious Diseases Cell: [phone] Pager: [phone]  09/25/2017, 7:02 PM

## 2017-09-26 ENCOUNTER — Ambulatory Visit: Payer: 59

## 2017-09-26 LAB — CBC
HEMATOCRIT: 37.6 % (ref 36.0–46.0)
HEMOGLOBIN: 11.4 g/dL — AB (ref 12.0–15.0)
MCH: 26.8 pg (ref 26.0–34.0)
MCHC: 30.3 g/dL (ref 30.0–36.0)
MCV: 88.3 fL (ref 78.0–100.0)
Platelets: 360 10*3/uL (ref 150–400)
RBC: 4.26 MIL/uL (ref 3.87–5.11)
RDW: 14 % (ref 11.5–15.5)
WBC: 13.7 10*3/uL — ABNORMAL HIGH (ref 4.0–10.5)

## 2017-09-26 MED ORDER — HEPARIN SOD (PORK) LOCK FLUSH 100 UNIT/ML IV SOLN
250.0000 [IU] | INTRAVENOUS | Status: AC | PRN
  Administered 2017-09-26: 250 [IU]

## 2017-09-26 MED ORDER — CEFAZOLIN IV (FOR PTA / DISCHARGE USE ONLY): 2.0000 g | Freq: Three times a day (TID) | INTRAVENOUS | 0 refills | Status: AC

## 2017-09-26 NOTE — Discharge Summary (Signed)
Physician Discharge Summary  Patient ID:  Melissa [name]  MRN: [id number]  DOB/AGE: 1970/10/01 47 y.o.  Admit date: 09/22/2017 Discharge date: 09/26/2017  Discharge Diagnoses:  1.  CELLULITIS OF AXILLA, RIGHT (L03.111)  2.  MALIGNANT NEOPLASM OF RIGHT BREAST, STAGE 1, ESTROGEN RECEPTOR POSITIVE (C50.911)             Story: Right breast biopsy at 2 o'clock on 06/29/2017 ([id number]) shows IDC, grad 1, ER - 70%, PR - 100%, Ki67 - 5%, Her2 - Negative             Right breast lumpectomy and right axillary SLNBx - 07/25/2017 - D. Nikalas Bramel. Her pathology [phone]) - 1.0 cm IDC, grade 2, 0/2 nodes            Oncology - [place]   Principal Problem:   Cellulitis  Operation:  None  Discharged Condition: good  Hospital Course: Melissa [name] is an 47 y.o. female whose primary care physician is [name], [name], MD and who was admitted 09/22/2017 with a chief complaint of cellulitis of the right axilla.     She had a right breast lumpectomy and right axillary SLNBx - 07/25/2017 - D. Tore Carreker.   She's had problems with a right axillary seroma that got infected. This required hospitalization at [facility] from 09/14/2017 - 09/16/2017. She was given vancomycin for 2 days on which her WBC improved. 60 cc was aspirated from the right axilla/  Cultures were staph species, pan sensitive. She was on doxycycline from 09/16/2017 to 09/19/2017, then switched to clindamycin on 09/19/2017.   She presented to the office on 09/22/2017 with fever, feeling worse, increasing right axillary cellulitis.  She has had 12 treatments of her rad tx. She is due for 8 more.   I admitted her to get her back on IV antibiotics and to have ID see her.  Dr. Retia Passe saw her for ID.  She was placed on Ancef.  She is getting better, though slowly.  A repeat US of her right axilla yesterday showed no fluid.  She is ready to go home.  Will continue IV antibiotics for a total of 14 days.  The discharge instructions were reviewed  with the patient.  Consults: Infectious disease, Dr. Retia Passe  Significant Diagnostic Studies: Results for orders placed or performed during the hospital encounter of 09/22/17  Culture, blood (routine x 2)  Result Value Ref Range   Specimen Description BLOOD LEFT ARM    Special Requests      BOTTLES DRAWN AEROBIC AND ANAEROBIC Blood Culture adequate volume   Culture      NO GROWTH 4 DAYS Performed at [facility], [place] [address], [place], [place] [address]    Report Status PENDING   MRSA PCR Screening  Result Value Ref Range   MRSA by PCR NEGATIVE NEGATIVE  CBC WITH DIFFERENTIAL  Result Value Ref Range   WBC 15.9 (H) 4.0 - 10.5 K/uL   RBC 3.97 3.87 - 5.11 MIL/uL   Hemoglobin 11.0 (L) 12.0 - 15.0 g/dL   HCT 34.8 (L) 36.0 - 46.0 %   MCV 87.7 78.0 - 100.0 fL   MCH 27.7 26.0 - 34.0 pg   MCHC 31.6 30.0 - 36.0 g/dL   RDW 13.7 11.5 - 15.5 %   Platelets 304 150 - 400 K/uL   Neutrophils Relative % 83 %   Neutro Abs 13.3 (H) 1.7 - 7.7 K/uL   Lymphocytes Relative 11 %   Lymphs Abs 1.7 0.7 -  4.0 K/uL   Monocytes Relative 4 %   Monocytes Absolute 0.6 0.1 - 1.0 K/uL   Eosinophils Relative 1 %   Eosinophils Absolute 0.1 0.0 - 0.7 K/uL   Basophils Relative 0 %   Basophils Absolute 0.1 0.0 - 0.1 K/uL   Immature Granulocytes 1 %   Abs Immature Granulocytes 0.2 (H) 0.0 - 0.1 K/uL  CBC with Differential/Platelet  Result Value Ref Range   WBC 14.0 (H) 4.0 - 10.5 K/uL   RBC 3.74 (L) 3.87 - 5.11 MIL/uL   Hemoglobin 10.4 (L) 12.0 - 15.0 g/dL   HCT 33.3 (L) 36.0 - 46.0 %   MCV 89.0 78.0 - 100.0 fL   MCH 27.8 26.0 - 34.0 pg   MCHC 31.2 30.0 - 36.0 g/dL   RDW 13.9 11.5 - 15.5 %   Platelets 300 150 - 400 K/uL   Neutrophils Relative % 82 %   Neutro Abs 11.4 (H) 1.7 - 7.7 K/uL   Lymphocytes Relative 11 %   Lymphs Abs 1.6 0.7 - 4.0 K/uL   Monocytes Relative 5 %   Monocytes Absolute 0.7 0.1 - 1.0 K/uL   Eosinophils Relative 1 %   Eosinophils Absolute 0.1 0.0 - 0.7 K/uL   Basophils  Relative 0 %   Basophils Absolute 0.1 0.0 - 0.1 K/uL   Immature Granulocytes 1 %   Abs Immature Granulocytes 0.1 0.0 - 0.1 K/uL  CBC with Differential/Platelet  Result Value Ref Range   WBC 12.3 (H) 4.0 - 10.5 K/uL   RBC 3.81 (L) 3.87 - 5.11 MIL/uL   Hemoglobin 10.5 (L) 12.0 - 15.0 g/dL   HCT 34.1 (L) 36.0 - 46.0 %   MCV 89.5 78.0 - 100.0 fL   MCH 27.6 26.0 - 34.0 pg   MCHC 30.8 30.0 - 36.0 g/dL   RDW 14.0 11.5 - 15.5 %   Platelets 323 150 - 400 K/uL   Neutrophils Relative % 78 %   Neutro Abs 9.7 (H) 1.7 - 7.7 K/uL   Lymphocytes Relative 13 %   Lymphs Abs 1.6 0.7 - 4.0 K/uL   Monocytes Relative 5 %   Monocytes Absolute 0.6 0.1 - 1.0 K/uL   Eosinophils Relative 2 %   Eosinophils Absolute 0.2 0.0 - 0.7 K/uL   Basophils Relative 1 %   Basophils Absolute 0.1 0.0 - 0.1 K/uL   Immature Granulocytes 1 %   Abs Immature Granulocytes 0.2 (H) 0.0 - 0.1 K/uL  CBC  Result Value Ref Range   WBC 13.7 (H) 4.0 - 10.5 K/uL   RBC 4.26 3.87 - 5.11 MIL/uL   Hemoglobin 11.4 (L) 12.0 - 15.0 g/dL   HCT 37.6 36.0 - 46.0 %   MCV 88.3 78.0 - 100.0 fL   MCH 26.8 26.0 - 34.0 pg   MCHC 30.3 30.0 - 36.0 g/dL   RDW 14.0 11.5 - 15.5 %   Platelets 360 150 - 400 K/uL    US Guided Needle Placement  Result Date: 09/15/2017 INDICATION: 47 year old with breast cancer and a postoperative seroma in the right breast/axillary region. Request for ultrasound-guided aspiration. EXAM: ULTRASOUND-GUIDED ASPIRATION OF RIGHT AXILLARY SEROMA MEDICATIONS: None ANESTHESIA/SEDATION: None COMPLICATIONS: None immediate. PROCEDURE: Informed written consent was obtained from the patient after a thorough discussion of the procedural risks, benefits and alternatives. All questions were addressed. A timeout was performed prior to the initiation of the procedure. The right axillary region was evaluated with ultrasound. A fluid collection in this area was identified. This area was  prepped with chlorhexidine and a sterile field was  created. Skin was anesthetized with 1% lidocaine. 10 cm Yueh catheter was directed into the fluid collection with ultrasound guidance. Total of 60 mL of yellow fluid was removed. The fluid was mildly cloudy. The collection was decompressed following the aspiration. Bandage placed over the puncture site. FINDINGS: Fluid collection in the right axillary region. 60 mL of yellow fluid was removed. The collection was decompressed at the end of the procedure with minimal residual fluid. IMPRESSION: Successful ultrasound-guided aspiration of the right axillary seroma. Fluid was sent for culture. Electronically Signed   By: Markus Daft M.D.   On: 09/15/2017 16:48   [place] Rt Upper Extrem Ltd Soft Tissue Non Vascular  Result Date: 09/15/2017 CLINICAL DATA:  47 year old female with history of RIGHT breast cancer status post RIGHT lumpectomy and sentinel lymph node excision on 07/25/2017, currently undergoing radiation therapy. RIGHT axillary seroma aspiration several weeks ago, now with fever and RIGHT axillary redness. EXAM: ULTRASOUND OF THE RIGHT AXILLA COMPARISON:  None FINDINGS: Ultrasound of the RIGHT axilla performed demonstrating a 5.1 x 5.8 x 5.8 cm fluid collection in the RIGHT axilla. No other abnormalities in the RIGHT axilla identified. IMPRESSION: 5.8 cm RIGHT axillary fluid collection - unable to determine sonographically if this is infected/abscess. RECOMMENDATION: Clinical follow-up. Electronically Signed   By: Margarette Canada M.D.   On: 09/15/2017 08:39   [place] Axilla Right  Result Date: 09/22/2017 CLINICAL DATA:  Patient presents for reassessment an area of right breast infection. She was recently hospitalized for her breast infection receiving IV vancomycin therapy. She also underwent abscess drainage while in the hospital. Her symptoms improved, but since discharge, her symptoms have worsened with increased redness, swelling and pain. The symptoms are in the upper outer right breast and right anterior, inferior  axilla. EXAM: ULTRASOUND OF THE RIGHT BREAST/AXILLA COMPARISON:  Previous exam(s). FINDINGS: On physical exam, the upper-outer quadrant of the right breast extending to the anterior inferior right axilla is swollen, erythematous, warm and firm as well is tender to palpation. No discrete mass. Targeted ultrasound is performed, showing increased echogenicity, consistent with inflammation, of the tissue throughout the upper outer right breast extending to the right axilla with fluid tracking between lobules inflamed fat. However, there is no defined fluid collection. There are no discrete masses. IMPRESSION: 1. Recurrent/persistent right breast infection without a defined, drainable abscess. RECOMMENDATION: Surgical follow-up. The patient will be seeing her breast surgeon this morning. I have discussed the findings and recommendations with the patient. Results were also provided in writing at the conclusion of the visit. If applicable, a reminder letter will be sent to the patient regarding the next appointment. BI-RADS CATEGORY  2: Benign. Electronically Signed   By: Lajean Manes M.D.   On: 09/22/2017 08:56   [place] Chest Soft Tissue  Result Date: 09/25/2017 CLINICAL DATA:  Right axillary abnormality, history of recent aspiration EXAM: ULTRASOUND OF HEAD/NECK SOFT TISSUES TECHNIQUE: Ultrasound examination of the head and neck soft tissues was performed in the area of clinical concern. COMPARISON:  09/15/2017 FINDINGS: Sonographic evaluation of the right axilla shows no sizable collection to suggest focal abscess. Some subcutaneous edema is noted as well as skin changes which may be related to the recent procedure. IMPRESSION: No findings to suggest focal abscess or recurrent seroma are seen. Subcutaneous changes are noted likely related to the recent drainage procedure. Electronically Signed   By: Inez Catalina M.D.   On: 09/25/2017 22:44   [place] Ekg  Site Rite  Result Date: 09/22/2017 If Site Rite image not attached,  placement could not be confirmed due to current cardiac rhythm.   Discharge Exam:  Vitals:   09/25/17 2125 09/26/17 0410  BP: 134/80 134/73  Pulse: 88 80  Resp: 18 18  Temp: 98.2 F (36.8 C) 98.7 F (37.1 C)  SpO2: 99% 98%    General: WN WF who is alert and generally healthy appearing.  Lungs: Clear to auscultation and symmetric breath sounds. Heart:  RRR. No murmur or rub. Right axilla:  She still has some redness around the incision.  The tenderness is better.  There is no obvious loculation of fluid.  Discharge Medications:   Allergies as of 09/26/2017   No Known Allergies     Medication List    STOP taking these medications   clindamycin 300 MG capsule Commonly known as:  CLEOCIN     TAKE these medications   acetaminophen 500 MG tablet Commonly known as:  TYLENOL Take 1,000 mg by mouth every 6 (six) hours as needed for mild pain.   ceFAZolin  IVPB Commonly known as:  ANCEF Inject 2 g into the vein every 8 (eight) hours for 10 days. Indication:  Axillary cellulitis/SSTI Last Day of Therapy:  10/05/2017 Labs - Once weekly:  CBC/D and BMP, Labs - Every other week:  Pull PICC at end of therapy   fexofenadine 180 MG tablet Commonly known as:  ALLEGRA Take 180 mg by mouth daily as needed for allergies or rhinitis.   ibuprofen 200 MG tablet Commonly known as:  ADVIL,MOTRIN Take 600 mg by mouth every 6 (six) hours as needed for fever, headache, moderate pain or cramping.   naproxen sodium 220 MG tablet Commonly known as:  ALEVE Take 220 mg by mouth daily as needed (pain).   RAPID GEL RX Gel Apply 1 application topically daily. Apply to right breast after radiation daily.            Home Infusion Instuctions  (From admission, onward)         Start     Ordered   09/26/17 0000  Home infusion instructions [facility] May follow [place] Dosing Protocol; May administer Cathflo as needed to maintain patency of vascular access device.; Flushing  of vascular access device: per [facility] Protocol: 0.9% NaCl pre/post medica...    Question Answer Comment  Instructions May follow [place] Dosing Protocol   Instructions May administer Cathflo as needed to maintain patency of vascular access device.   Instructions Flushing of vascular access device: per [facility] Protocol: 0.9% NaCl pre/post medication administration and prn patency; Heparin 100 u/ml, 35m for implanted ports and Heparin 10u/ml, 552mfor all other central venous catheters.   Instructions May follow [facility] Anaphylaxis Protocol for First Dose Administration in the home: 0.9% NaCl at 25-50 ml/hr to maintain IV access for protocol meds. Epinephrine 0.3 ml IV/IM PRN and Benadryl 25-50 IV/IM PRN s/s of anaphylaxis.   Instructions [facility] Infusion Coordinator (RN) to assist per patient IV care needs in the home PRN.      09/26/17 1246          Disposition:   Discharge Instructions    Home infusion instructions [facility] May follow [facility] Protocol; May administer Cathflo as needed to maintain patency of vascular access device.; Flushing of vascular access device: [facility]: 0.9% NaCl pre/post medica...   Complete by:  As directed    Instructions:  May  follow [place] Dosing Protocol   Instructions:  May administer Cathflo as needed to maintain patency of vascular access device.   Instructions:  Flushing of vascular access device: per [facility] Protocol: 0.9% NaCl pre/post medication administration and prn patency; Heparin 100 u/ml, 5m for implanted ports and Heparin 10u/ml, 572mfor all other central venous catheters.   Instructions:  May follow [facility] Anaphylaxis Protocol for First Dose Administration in the home: 0.9% NaCl at 25-50 ml/hr to maintain IV access for protocol meds. Epinephrine 0.3 ml IV/IM PRN and Benadryl 25-50 IV/IM PRN s/s of anaphylaxis.   Instructions:  [place] Coord[place] (RN) to assist per patient IV care needs in the  home PRN.         Signed: DaAlphonsa OverallM.D., FAPark[facility] Office:  [phone]/01/2018, 12:51 PM

## 2017-09-26 NOTE — Progress Notes (Signed)
IV Team: PICC flushed and capped for discharge. Changed gauze dressing to transparent dressing w Biopatch. Reinforced home PICC care. Pt and husband voiced understanding. [facility] RN is scheduled to come out tomorrow.

## 2017-09-26 NOTE — Discharge Instructions (Signed)
[facility] - DISCHARGE INSTRUCTIONS TO PATIENT  Activity:  Driving - May drive   Lifting - Protect left arm   Diet:  As tolerated  Follow up appointment:  Call Dr. Pollie Friar office [facility]) at [phone] for an appointment in later this week or early next week.  Medications and dosages:  Resume your home medications.  You have a prescription for:  IV Ancef  Call Dr. Lucia Gaskins or his office  [phone]) if you have:  Temperature greater than 100.4,  Persistent nausea and vomiting,  Severe uncontrolled pain,  Redness, tenderness, or signs of infection (pain, swelling, redness, odor or green/yellow discharge around the site),  Difficulty breathing, headache or visual disturbances,  Any other questions or concerns you may have after discharge.  In an emergency, call 911 or go to an Emergency Department at a nearby hospital.

## 2017-09-26 NOTE — Care Management Note (Signed)
Case Management Note  Patient Details  Name: Glorine Hanratty MRN: [id number] Date of Birth: 1971-01-19  Subjective/Objective:                    Action/Plan:   Expected Discharge Date:  09/26/17               Expected Discharge Plan:  [place]  In-House Referral:     Discharge planning Services  CM Consult  Post Acute Care Choice:    Choice offered to:  Patient  DME Arranged:  N/A DME Agency:  NA  HH Arranged:  RN [place] Agency:  [place]  Status of Service:  Completed, signed off  If discussed at [place] of Stay Meetings, dates discussed:    Additional Comments:  Marilu Favre, RN 09/26/2017, 2:56 PM

## 2017-09-27 ENCOUNTER — Ambulatory Visit: Payer: 59

## 2017-09-27 LAB — CULTURE, BLOOD (ROUTINE X 2)
CULTURE: NO GROWTH
Special Requests: ADEQUATE

## 2017-09-28 ENCOUNTER — Ambulatory Visit: Payer: 59

## 2017-09-29 ENCOUNTER — Ambulatory Visit: Payer: 59

## 2017-09-30 ENCOUNTER — Ambulatory Visit: Payer: 59

## 2017-09-30 ENCOUNTER — Telehealth: Payer: Self-pay | Admitting: Hematology and Oncology

## 2017-09-30 NOTE — Telephone Encounter (Signed)
Spoke to patient regarding upcoming aug appts per 8/16 sch message

## 2017-10-02 ENCOUNTER — Ambulatory Visit: Admission: RE | Admit: 2017-10-02 | Payer: 59 | Source: Ambulatory Visit

## 2017-10-03 ENCOUNTER — Ambulatory Visit
Admission: RE | Admit: 2017-10-03 | Discharge: 2017-10-03 | Disposition: A | Discharge status: Home / Self Care | Payer: 59 | Source: Ambulatory Visit | Attending: Radiation Oncology | Admitting: Radiation Oncology

## 2017-10-03 ENCOUNTER — Ambulatory Visit: Admission: RE | Admit: 2017-10-03 | Payer: 59 | Source: Ambulatory Visit

## 2017-10-03 ENCOUNTER — Ambulatory Visit: Payer: 59

## 2017-10-03 ENCOUNTER — Other Ambulatory Visit: Payer: Self-pay

## 2017-10-03 VITALS — BP 172/87 | HR 99 | Temp 98.3°F | Resp 20 | Ht 63.0 in | Wt 182.8 lb

## 2017-10-03 DIAGNOSIS — C50211 Malignant neoplasm of upper-inner quadrant of right female breast: Secondary | ICD-10-CM

## 2017-10-03 DIAGNOSIS — Z170 Estrogen receptor positive status [ER+]: Secondary | ICD-10-CM | POA: Diagnosis not present

## 2017-10-03 DIAGNOSIS — Z510 Encounter for antineoplastic radiation therapy: Secondary | ICD-10-CM | POA: Diagnosis not present

## 2017-10-03 NOTE — Progress Notes (Signed)
Ms. Gruetzmacher presents for evaluation after recent cellulitis to her right breast. She is receiving iv antibiotics via a PICC line at home 3 times daily.   BP (!) 172/87 (BP Location: Right Leg)   Pulse 99   Temp 98.3 F (36.8 C) (Oral)   Resp 20   Ht 5\' 3"  (1.6 m)   Wt 182 lb 12.8 oz (82.9 kg)   LMP 09/11/2017   SpO2 100%   BMI 32.38 kg/m    Wt Readings from Last 3 Encounters:  10/03/17 182 lb 12.8 oz (82.9 kg)  09/22/17 184 lb 8 oz (83.7 kg)  09/14/17 180 lb (81.6 kg)

## 2017-10-04 ENCOUNTER — Ambulatory Visit: Payer: 59

## 2017-10-04 ENCOUNTER — Ambulatory Visit
Admission: RE | Admit: 2017-10-04 | Discharge: 2017-10-04 | Disposition: A | Discharge status: Home / Self Care | Payer: 59 | Source: Ambulatory Visit | Attending: Radiation Oncology | Admitting: Radiation Oncology

## 2017-10-04 DIAGNOSIS — Z510 Encounter for antineoplastic radiation therapy: Secondary | ICD-10-CM | POA: Diagnosis not present

## 2017-10-04 NOTE — Progress Notes (Signed)
Radiation Oncology         [phone] ________________________________  Name: Melissa [name] MRN: [id number]  Date: 10/03/2017  DOB: 1970-02-27  Follow-Up Visit Note  Outpatient  CC: [name], [name], MD  [name], MD  Diagnosis and Prior Radiotherapy:    ICD-10-CM   1. Malignant neoplasm of upper-inner quadrant of right breast in female, estrogen receptor positive ([place]) C50.211    Z17.0    Cancer Staging Malignant neoplasm of upper-inner quadrant of right breast in female, estrogen receptor positive ([place]) Staging form: Breast, AJCC 8th Edition - Clinical stage from 07/06/2017: Stage IA (cT1b, cN0, cM0, G1, ER+, PR+, HER2-) - Unsigned - Pathologic: Stage IA (pT1b, pN0, cM0, G2, ER+, PR+, HER2-) - Unsigned   CHIEF COMPLAINT: breast cancer, infection  Narrative:  The patient returns today for evaluation after recent cellulitis to her right axilla/breast at the SLN biopsy site. She is receiving IV antibiotics via a PICC line at home 3 times daily. She has been on break from radiotherapy for almost 3 weeks.    I spoke with Dr. Lucia Gaskins about her and since she has responded well clinically to ABX he wanted me to reconsider starting RT for her now.  She reports still feeling a bit off, and tired, since her infection was initially detected.                          ALLERGIES:  has No Known Allergies.  Meds: Current Outpatient Medications  Medication Sig Dispense Refill  . acetaminophen (TYLENOL) 500 MG tablet Take 1,000 mg by mouth every 6 (six) hours as needed for mild pain.     Marland Kitchen ceFAZolin (ANCEF) IVPB Inject 2 g into the vein every 8 (eight) hours for 10 days. Indication:  Axillary cellulitis/SSTI Last Day of Therapy:  10/05/2017 Labs - Once weekly:  CBC/D and BMP, Labs - Every other week:  Pull PICC at end of therapy 30 Units 0  . fexofenadine (ALLEGRA) 180 MG tablet Take 180 mg by mouth daily as needed for allergies or rhinitis.    . Homeopathic Products (RAPID  GEL RX) GEL Apply 1 application topically daily. Apply to right breast after radiation daily.    Marland Kitchen ibuprofen (ADVIL,MOTRIN) 200 MG tablet Take 600 mg by mouth every 6 (six) hours as needed for fever, headache, moderate pain or cramping.     . naproxen sodium (ALEVE) 220 MG tablet Take 220 mg by mouth daily as needed (pain).      No current facility-administered medications for this encounter.     Physical Findings: The patient is in no acute distress. Patient is alert and oriented.  height is 5' 3" (1.6 m) and weight is 182 lb 12.8 oz (82.9 kg). Her oral temperature is 98.3 F (36.8 C). Her blood pressure is 172/87 (abnormal) and her pulse is 99. Her respiration is 20 and oxygen saturation is 100%. .     No sign of persistent infection in right axilla/breast/skin; no excessive warmth or fluctuance.  There is thickening underneath the right axillary scar where infection was drained   Lab Findings: Lab Results  Component Value Date   WBC 13.7 (H) 09/26/2017   HGB 11.4 (L) 09/26/2017   HCT 37.6 09/26/2017   MCV 88.3 09/26/2017   PLT 360 09/26/2017    Radiographic Findings: US Guided Needle Placement  Result Date: 09/15/2017 INDICATION: 47 year old with breast cancer and a postoperative seroma in the right breast/axillary region. Request for  ultrasound-guided aspiration. EXAM: ULTRASOUND-GUIDED ASPIRATION OF RIGHT AXILLARY SEROMA MEDICATIONS: None ANESTHESIA/SEDATION: None COMPLICATIONS: None immediate. PROCEDURE: Informed written consent was obtained from the patient after a thorough discussion of the procedural risks, benefits and alternatives. All questions were addressed. A timeout was performed prior to the initiation of the procedure. The right axillary region was evaluated with ultrasound. A fluid collection in this area was identified. This area was prepped with chlorhexidine and a sterile field was created. Skin was anesthetized with 1% lidocaine. 10 cm Yueh catheter was directed  into the fluid collection with ultrasound guidance. Total of 60 mL of yellow fluid was removed. The fluid was mildly cloudy. The collection was decompressed following the aspiration. Bandage placed over the puncture site. FINDINGS: Fluid collection in the right axillary region. 60 mL of yellow fluid was removed. The collection was decompressed at the end of the procedure with minimal residual fluid. IMPRESSION: Successful ultrasound-guided aspiration of the right axillary seroma. Fluid was sent for culture. Electronically Signed   By: Adam  Henn M.D.   On: 09/15/2017 16:48   Us Rt Upper Extrem Ltd Soft Tissue Non Vascular  Result Date: 09/15/2017 CLINICAL DATA:  47-year-old female with history of RIGHT breast cancer status post RIGHT lumpectomy and sentinel lymph node excision on 07/25/2017, currently undergoing radiation therapy. RIGHT axillary seroma aspiration several weeks ago, now with fever and RIGHT axillary redness. EXAM: ULTRASOUND OF THE RIGHT AXILLA COMPARISON:  None FINDINGS: Ultrasound of the RIGHT axilla performed demonstrating a 5.1 x 5.8 x 5.8 cm fluid collection in the RIGHT axilla. No other abnormalities in the RIGHT axilla identified. IMPRESSION: 5.8 cm RIGHT axillary fluid collection - unable to determine sonographically if this is infected/abscess. RECOMMENDATION: Clinical follow-up. Electronically Signed   By: Jeffrey  Hu M.D.   On: 09/15/2017 08:39   Us Axilla Right  Result Date: 09/22/2017 CLINICAL DATA:  Patient presents for reassessment an area of right breast infection. She was recently hospitalized for her breast infection receiving IV vancomycin therapy. She also underwent abscess drainage while in the hospital. Her symptoms improved, but since discharge, her symptoms have worsened with increased redness, swelling and pain. The symptoms are in the upper outer right breast and right anterior, inferior axilla. EXAM: ULTRASOUND OF THE RIGHT BREAST/AXILLA COMPARISON:  Previous exam(s).  FINDINGS: On physical exam, the upper-outer quadrant of the right breast extending to the anterior inferior right axilla is swollen, erythematous, warm and firm as well is tender to palpation. No discrete mass. Targeted ultrasound is performed, showing increased echogenicity, consistent with inflammation, of the tissue throughout the upper outer right breast extending to the right axilla with fluid tracking between lobules inflamed fat. However, there is no defined fluid collection. There are no discrete masses. IMPRESSION: 1. Recurrent/persistent right breast infection without a defined, drainable abscess. RECOMMENDATION: Surgical follow-up. The patient will be seeing her breast surgeon this morning. I have discussed the findings and recommendations with the patient. Results were also provided in writing at the conclusion of the visit. If applicable, a reminder letter will be sent to the patient regarding the next appointment. BI-RADS CATEGORY  2: Benign. Electronically Signed   By: David  Ormond M.D.   On: 09/22/2017 08:56   Us Chest Soft Tissue  Result Date: 09/25/2017 CLINICAL DATA:  Right axillary abnormality, history of recent aspiration EXAM: ULTRASOUND OF HEAD/NECK SOFT TISSUES TECHNIQUE: Ultrasound examination of the head and neck soft tissues was performed in the area of clinical concern. COMPARISON:  09/15/2017 FINDINGS: Sonographic evaluation of   the right axilla shows no sizable collection to suggest focal abscess. Some subcutaneous edema is noted as well as skin changes which may be related to the recent procedure. IMPRESSION: No findings to suggest focal abscess or recurrent seroma are seen. Subcutaneous changes are noted likely related to the recent drainage procedure. Electronically Signed   By: Mark  Lukens M.D.   On: 09/25/2017 22:44   Us Ekg Site Rite  Result Date: 09/22/2017 If Site Rite image not attached, placement could not be confirmed due to current cardiac  rhythm.   Impression/Plan:  Today, I talked to the patient about her circumstances.  This infection was very difficult for her. We discussed the patient's diagnosis of breast cancer, complicated by infection, and general treatment for both.  We discussed the risks, benefits, and side effects of resuming radiotherapy.   We discussed the risks of holding RT longer.  I think the risks of resuming RT are far less than the risks of furtherdelaying her completion of RT, due to risk of tumor repopulation.  I recommended resuming whole breast RT today.The patient was encouraged to ask questions that I answered to the best of my ability.   After much discussion, she would like to defer further RT to the whole breast and only receive RT to the tumor cavity (boost treatment) for the remainder of her treatments.  This will be medial to where the infection was and focus just on the tissue that is highest risk for cancer relapse.  I will adjust her prescription and give 8 more fractions at a hypofractionated dose to the lumpectomy cavity alone.  She is pleased with this plan.  She prefers to hold RT one more day and will resume tomorrow, rather than today. We will do so.  I spent 15 minutes face to face with the patient and more than 50% of that time was spent in counseling and/or coordination of care. _____________________________________    , MD 

## 2017-10-05 ENCOUNTER — Encounter: Payer: Self-pay | Admitting: Internal Medicine

## 2017-10-05 ENCOUNTER — Ambulatory Visit: Payer: 59

## 2017-10-05 ENCOUNTER — Encounter: Payer: Self-pay | Admitting: Radiation Oncology

## 2017-10-05 ENCOUNTER — Ambulatory Visit (INDEPENDENT_AMBULATORY_CARE_PROVIDER_SITE_OTHER): Payer: 59 | Admitting: Internal Medicine

## 2017-10-05 ENCOUNTER — Ambulatory Visit
Admission: RE | Admit: 2017-10-05 | Discharge: 2017-10-05 | Disposition: A | Discharge status: Home / Self Care | Payer: 59 | Source: Ambulatory Visit | Attending: Radiation Oncology | Admitting: Radiation Oncology

## 2017-10-05 VITALS — Temp 98.6°F | Wt 182.6 lb

## 2017-10-05 DIAGNOSIS — L03111 Cellulitis of right axilla: Secondary | ICD-10-CM | POA: Diagnosis not present

## 2017-10-05 DIAGNOSIS — Z510 Encounter for antineoplastic radiation therapy: Secondary | ICD-10-CM | POA: Diagnosis not present

## 2017-10-05 DIAGNOSIS — Z853 Personal history of malignant neoplasm of breast: Secondary | ICD-10-CM

## 2017-10-05 NOTE — Progress Notes (Signed)
   RFV: follow up for right axilla cellulitis Patient ID: Melissa [name], female   DOB: 06/28/1970, 47 y.o.   MRN: [id number]  HPI 47yo F with history of recently diagnosed breast ca s/p lumpectomy and sentinal node biopsy. Now receiving radiation. Had developed axillary cellulitis near the incision of LN. She had failed oral therapy and admitted for IV abtx. Given 10 day of ancef. Doing well. Outpatient Encounter Medications as of 10/05/2017  Medication Sig  . acetaminophen (TYLENOL) 500 MG tablet Take 1,000 mg by mouth every 6 (six) hours as needed for mild pain.   Marland Kitchen ceFAZolin (ANCEF) IVPB Inject 2 g into the vein every 8 (eight) hours for 10 days. Indication:  Axillary cellulitis/SSTI Last Day of Therapy:  10/05/2017 Labs - Once weekly:  CBC/D and BMP, Labs - Every other week:  Pull PICC at end of therapy  . fexofenadine (ALLEGRA) 180 MG tablet Take 180 mg by mouth daily as needed for allergies or rhinitis.  . Homeopathic Products (RAPID GEL RX) GEL Apply 1 application topically daily. Apply to right breast after radiation daily.  Marland Kitchen ibuprofen (ADVIL,MOTRIN) 200 MG tablet Take 600 mg by mouth every 6 (six) hours as needed for fever, headache, moderate pain or cramping.   . naproxen sodium (ALEVE) 220 MG tablet Take 220 mg by mouth daily as needed (pain).    No facility-administered encounter medications on file as of 10/05/2017.      Patient Active Problem List   Diagnosis Date Noted  . Cellulitis 09/22/2017  . S/P lumpectomy, right breast 09/14/2017  . Genetic testing 07/13/2017  . Family history of prostate cancer   . Malignant neoplasm of upper-inner quadrant of right breast in female, estrogen receptor positive ([place]) 07/06/2017     Health Maintenance Due  Topic Date Due  . TETANUS/TDAP  09/30/1988  . PAP SMEAR  10/01/1990  . INFLUENZA VACCINE  09/15/2017     Review of Systems  Physical Exam   Temp 98.6 F (37 C)   Wt 182 lb 9.6 oz (82.8 kg)   LMP 09/11/2017   BMI  32.35 kg/m   Physical Exam  Constitutional:  oriented to person, place, and time. appears well-developed and well-nourished. No distress.  HENT: [place]/AT, PERRLA, no scleral icterus Mouth/Throat: Oropharynx is clear and moist. No oropharyngeal exudate.  Chest wall= erythema resolved, mild induration by incision Skin: Skin is warm and dry. No rash noted. No erythema.  Psychiatric: a normal mood and affect.  behavior is normal.   CBC Lab Results  Component Value Date   WBC 13.7 (H) 09/26/2017   RBC 4.26 09/26/2017   HGB 11.4 (L) 09/26/2017   HCT 37.6 09/26/2017   PLT 360 09/26/2017   MCV 88.3 09/26/2017   MCH 26.8 09/26/2017   MCHC 30.3 09/26/2017   RDW 14.0 09/26/2017   LYMPHSABS 1.6 09/24/2017   MONOABS 0.6 09/24/2017   EOSABS 0.2 09/24/2017    BMET Lab Results  Component Value Date   NA 142 09/16/2017   K 3.9 09/16/2017   CL 109 09/16/2017   CO2 24 09/16/2017   GLUCOSE 90 09/16/2017   BUN 5 (L) 09/16/2017   CREATININE 0.76 09/16/2017   CALCIUM 8.4 (L) 09/16/2017   GFRNONAA >60 09/16/2017   GFRAA >60 09/16/2017      Assessment and Plan  Cellulitis = Will check sed rate and crp tomorrow with advance to decide if need to do orals. Exam looks good. Plan on removal of picc line

## 2017-10-06 ENCOUNTER — Ambulatory Visit: Payer: 59

## 2017-10-06 ENCOUNTER — Ambulatory Visit
Admission: RE | Admit: 2017-10-06 | Discharge: 2017-10-06 | Disposition: A | Discharge status: Home / Self Care | Payer: 59 | Source: Ambulatory Visit | Attending: Radiation Oncology | Admitting: Radiation Oncology

## 2017-10-06 DIAGNOSIS — Z510 Encounter for antineoplastic radiation therapy: Secondary | ICD-10-CM | POA: Diagnosis not present

## 2017-10-07 ENCOUNTER — Ambulatory Visit
Admission: RE | Admit: 2017-10-07 | Discharge: 2017-10-07 | Disposition: A | Discharge status: Home / Self Care | Payer: 59 | Source: Ambulatory Visit | Attending: Radiation Oncology | Admitting: Radiation Oncology

## 2017-10-07 ENCOUNTER — Ambulatory Visit: Payer: 59

## 2017-10-07 DIAGNOSIS — Z510 Encounter for antineoplastic radiation therapy: Secondary | ICD-10-CM | POA: Diagnosis not present

## 2017-10-10 ENCOUNTER — Ambulatory Visit: Payer: 59

## 2017-10-10 ENCOUNTER — Ambulatory Visit
Admission: RE | Admit: 2017-10-10 | Discharge: 2017-10-10 | Disposition: A | Discharge status: Home / Self Care | Payer: 59 | Source: Ambulatory Visit | Attending: Radiation Oncology | Admitting: Radiation Oncology

## 2017-10-10 DIAGNOSIS — Z510 Encounter for antineoplastic radiation therapy: Secondary | ICD-10-CM | POA: Diagnosis not present

## 2017-10-10 DIAGNOSIS — C50211 Malignant neoplasm of upper-inner quadrant of right female breast: Secondary | ICD-10-CM

## 2017-10-10 DIAGNOSIS — Z170 Estrogen receptor positive status [ER+]: Principal | ICD-10-CM

## 2017-10-10 MED ORDER — RADIAPLEXRX EX GEL
Freq: Once | CUTANEOUS | Status: AC
  Administered 2017-10-10: 12:00:00 via TOPICAL

## 2017-10-11 ENCOUNTER — Ambulatory Visit
Admission: RE | Admit: 2017-10-11 | Discharge: 2017-10-11 | Disposition: A | Discharge status: Home / Self Care | Payer: 59 | Source: Ambulatory Visit | Attending: Radiation Oncology | Admitting: Radiation Oncology

## 2017-10-11 ENCOUNTER — Telehealth: Payer: Self-pay | Admitting: *Deleted

## 2017-10-11 ENCOUNTER — Ambulatory Visit: Payer: 59

## 2017-10-11 DIAGNOSIS — Z510 Encounter for antineoplastic radiation therapy: Secondary | ICD-10-CM | POA: Diagnosis not present

## 2017-10-11 NOTE — Telephone Encounter (Signed)
Patient called for her lab results (drawn 8/23 by [facility]) and the next step. Per Cassie, ESR=68, CRP=10. Please advise. Landis Gandy, RN

## 2017-10-12 ENCOUNTER — Ambulatory Visit: Payer: 59

## 2017-10-12 ENCOUNTER — Ambulatory Visit
Admission: RE | Admit: 2017-10-12 | Discharge: 2017-10-12 | Disposition: A | Discharge status: Home / Self Care | Payer: 59 | Source: Ambulatory Visit | Attending: Radiation Oncology | Admitting: Radiation Oncology

## 2017-10-12 DIAGNOSIS — Z510 Encounter for antineoplastic radiation therapy: Secondary | ICD-10-CM | POA: Diagnosis not present

## 2017-10-13 ENCOUNTER — Ambulatory Visit
Admission: RE | Admit: 2017-10-13 | Discharge: 2017-10-13 | Disposition: A | Discharge status: Home / Self Care | Payer: 59 | Source: Ambulatory Visit | Attending: Radiation Oncology | Admitting: Radiation Oncology

## 2017-10-13 ENCOUNTER — Ambulatory Visit: Payer: 59

## 2017-10-13 ENCOUNTER — Inpatient Hospital Stay: Payer: 59 | Attending: Hematology and Oncology | Admitting: Hematology and Oncology

## 2017-10-13 ENCOUNTER — Encounter: Payer: Self-pay | Admitting: Radiation Oncology

## 2017-10-13 ENCOUNTER — Telehealth: Payer: Self-pay | Admitting: Hematology and Oncology

## 2017-10-13 DIAGNOSIS — Z510 Encounter for antineoplastic radiation therapy: Secondary | ICD-10-CM | POA: Diagnosis not present

## 2017-10-13 DIAGNOSIS — Z79810 Long term (current) use of selective estrogen receptor modulators (SERMs): Secondary | ICD-10-CM | POA: Insufficient documentation

## 2017-10-13 DIAGNOSIS — Z170 Estrogen receptor positive status [ER+]: Secondary | ICD-10-CM | POA: Insufficient documentation

## 2017-10-13 DIAGNOSIS — Z923 Personal history of irradiation: Secondary | ICD-10-CM | POA: Insufficient documentation

## 2017-10-13 DIAGNOSIS — L598 Other specified disorders of the skin and subcutaneous tissue related to radiation: Secondary | ICD-10-CM | POA: Diagnosis not present

## 2017-10-13 DIAGNOSIS — C50211 Malignant neoplasm of upper-inner quadrant of right female breast: Secondary | ICD-10-CM | POA: Diagnosis not present

## 2017-10-13 MED ORDER — TAMOXIFEN CITRATE 20 MG PO TABS: 20.0000 mg | ORAL_TABLET | Freq: Every day | ORAL | 3 refills | Status: DC

## 2017-10-13 NOTE — Telephone Encounter (Signed)
Gave patient avs and calendar.   °

## 2017-10-13 NOTE — Progress Notes (Signed)
Patient Care Team: [name], [name], MD as PCP - General (Family Medicine) Alphonsa Overall, MD as Consulting Physician (General Surgery) [name], MD as Consulting Physician (Hematology and Oncology) Eppie Gibson, MD as Attending Physician (Radiation Oncology) Carlyle Basques, MD as Consulting Physician (Infectious Diseases)  DIAGNOSIS:  Encounter Diagnosis  Name Primary?  . Malignant neoplasm of upper-inner quadrant of right breast in female, estrogen receptor positive ([place])     SUMMARY OF ONCOLOGIC HISTORY:   Malignant neoplasm of upper-inner quadrant of right breast in female, estrogen receptor positive ([place])   06/29/2017 Initial Diagnosis    Screening detected right breast mass at 2 o'clock position 0.7 cm, axillary ultrasound negative.  Biopsy revealed IDC grade 1 ER 90%, PR 100%, Ki-67 5%, HER-2 negative ratio 1.19, T1 BN 0 stage I a clinical stage AJCC 8    07/13/2017 Genetic Testing    Negative genetic testing on the 9 gene STAT panel.  The STAT Breast cancer panel offered by [facility] includes sequencing and rearrangement analysis for the following 9 genes:  ATM, BRCA1, BRCA2, CDH1, CHEK2, PALB2, PTEN, STK11 and TP53.   The report date is Jul 13, 2017.  The Multi-Gene Panel offered by [facility] includes sequencing and/or deletion duplication testing of the following 83 genes: ALK, APC, ATM, AXIN2,BAP1,  BARD1, BLM, BMPR1A, BRCA1, BRCA2, BRIP1, CASR, CDC73, CDH1, CDK4, CDKN1B, CDKN1C, CDKN2A (p14ARF), CDKN2A (p16INK4a), CEBPA, CHEK2, CTNNA1, DICER1, DIS3L2, EGFR (c.2369C>T, p.Thr790Met variant only), EPCAM (Deletion/duplication testing only), FH, FLCN, GATA2, GPC3, GREM1 (Promoter region deletion/duplication testing only), HOXB13 (c.251G>A, p.Gly84Glu), HRAS, KIT, MAX, MEN1, MET, MITF (c.952G>A, p.Glu318Lys variant only), MLH1, MSH2, MSH3, MSH6, MUTYH, NBN, NF1, NF2, NTHL1, PALB2, PDGFRA, PHOX2B, PMS2, POLD1, POLE, POT1, PRKAR1A, PTCH1, PTEN, RAD50, RAD51C, RAD51D, RB1, RECQL4, RET,  RUNX1, SDHAF2, SDHA (sequence changes only), SDHB, SDHC, SDHD, SMAD4, SMARCA4, SMARCB1, SMARCE1, STK11, SUFU, TERT, TERT, TMEM127, TP53, TSC1, TSC2, VHL, WRN and WT1.   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  The report date is Jul 13, 2017.    07/25/2017 Surgery    Right lumpectomy: IDC grade 2, 1 cm, margins negative, negative for lymphovascular or perineural invasion, 0/2 lymph nodes negative, ER 70%, PR 100%, HER-2 negative ratio 1.19, Ki-67 5%, T1 be N0 stage Ia    08/12/2017 Oncotype testing    Oncotype score 16: Distal recurrence risk at 9 years: 4% with antiestrogen therapy    08/30/2017 - 10/13/2017 Radiation Therapy    Adjuvant radiation     CHIEF COMPLIANT: Follow-up after radiation therapy  INTERVAL HISTORY: Melissa [name] is a 47-year with above-mentioned history of right breast cancer treated with lumpectomy and is today finishing her last dosage of radiation.  In the interim she had a breast infection that slowed down her radiation treatments.  She denies any complaints other than radiation dermatitis.  REVIEW OF SYSTEMS:   Constitutional: Denies fevers, chills or abnormal weight loss Eyes: Denies blurriness of vision Ears, nose, mouth, throat, and face: Denies mucositis or sore throat Respiratory: Denies cough, dyspnea or wheezes Cardiovascular: Denies palpitation, chest discomfort Gastrointestinal:  Denies nausea, heartburn or change in bowel habits Skin: Denies abnormal skin rashes Lymphatics: Denies new lymphadenopathy or easy bruising Neurological:Denies numbness, tingling or new weaknesses Behavioral/Psych: Mood is stable, no new changes  Extremities: No lower extremity edema Breast: Recent breast infections which got treated with antibiotics. All other systems were reviewed with the patient and are negative.  I have reviewed the past medical history, past surgical history, social history and family history with  the patient and they are  unchanged from previous note.  ALLERGIES:  has No Known Allergies.  MEDICATIONS:  Current Outpatient Medications  Medication Sig Dispense Refill  . acetaminophen (TYLENOL) 500 MG tablet Take 1,000 mg by mouth every 6 (six) hours as needed for mild pain.     . fexofenadine (ALLEGRA) 180 MG tablet Take 180 mg by mouth daily as needed for allergies or rhinitis.    . Homeopathic Products (RAPID GEL RX) GEL Apply 1 application topically daily. Apply to right breast after radiation daily.    Marland Kitchen ibuprofen (ADVIL,MOTRIN) 200 MG tablet Take 600 mg by mouth every 6 (six) hours as needed for fever, headache, moderate pain or cramping.     . naproxen sodium (ALEVE) 220 MG tablet Take 220 mg by mouth daily as needed (pain).      No current facility-administered medications for this visit.     PHYSICAL EXAMINATION: ECOG PERFORMANCE STATUS: 1 - Symptomatic but completely ambulatory  Vitals:   10/13/17 1003  BP: 124/86  Pulse: 90  Resp: 20  Temp: 98.2 F (36.8 C)  SpO2: 100%   Filed Weights   10/13/17 1003  Weight: 181 lb 12.8 oz (82.5 kg)    GENERAL:alert, no distress and comfortable SKIN: skin color, texture, turgor are normal, no rashes or significant lesions EYES: normal, Conjunctiva are pink and non-injected, sclera clear OROPHARYNX:no exudate, no erythema and lips, buccal mucosa, and tongue normal  NECK: supple, thyroid normal size, non-tender, without nodularity LYMPH:  no palpable lymphadenopathy in the cervical, axillary or inguinal LUNGS: clear to auscultation and percussion with normal breathing effort HEART: regular rate & rhythm and no murmurs and no lower extremity edema ABDOMEN:abdomen soft, non-tender and normal bowel sounds MUSCULOSKELETAL:no cyanosis of digits and no clubbing  NEURO: alert & oriented x 3 with fluent speech, no focal motor/sensory deficits EXTREMITIES: No lower extremity edema   LABORATORY DATA:  I have reviewed the data as listed CMP Latest Ref Rng &  Units 09/16/2017 09/14/2017 07/06/2017  Glucose 70 - 99 mg/dL 90 163(H) 103  BUN 6 - 20 mg/dL 5(L) 6 10  Creatinine 0.44 - 1.00 mg/dL 0.76 0.81 0.90  Sodium 135 - 145 mmol/L 142 140 142  Potassium 3.5 - 5.1 mmol/L 3.9 3.5 4.4  Chloride 98 - 111 mmol/L 109 108 109  CO2 22 - 32 mmol/L '24 23 22  '$ Calcium 8.9 - 10.3 mg/dL 8.4(L) 8.6(L) 8.9  Total Protein 6.5 - 8.1 g/dL - 6.6 6.8  Total Bilirubin 0.3 - 1.2 mg/dL - 0.8 0.4  Alkaline Phos 38 - 126 U/L - 86 81  AST 15 - 41 U/L - 24 25  ALT 0 - 44 U/L - 23 25    Lab Results  Component Value Date   WBC 13.7 (H) 09/26/2017   HGB 11.4 (L) 09/26/2017   HCT 37.6 09/26/2017   MCV 88.3 09/26/2017   PLT 360 09/26/2017   NEUTROABS 9.7 (H) 09/24/2017    ASSESSMENT & PLAN:  Malignant neoplasm of upper-inner quadrant of right breast in female, estrogen receptor positive ([place]) 06/29/2017:Screening detected right breast mass at 2 o'clock position 0.7 cm, axillary ultrasound negative. Biopsy revealed IDC grade 1 ER 90%, PR 100%, Ki-67 5%, HER-2 negative ratio 1.19, T1 BN 0 stage I a clinical stage AJCC 8  07/25/2017: Right lumpectomy: IDC grade 2, 1 cm, margins negative, negative for lymphovascular or perineural invasion, 0/2 lymph nodes negative, ER 70%, PR 100%, HER-2 negative ratio 1.19, Ki-67 5%, T1 be  N0 stage Ia Oncotype score 16: Distal recurrence risk at 9 years: 4% with antiestrogen therapy Adjuvant radiation therapy 08/30/2017 to 10/13/2017  Recommendation: Adjuvant antiestrogen therapy with tamoxifen 20 mg daily  We discussed the risks and benefits of tamoxifen. These include but not limited to insomnia, hot flashes, mood changes, vaginal dryness, and weight gain. Although rare, serious side effects including endometrial cancer, risk of blood clots were also discussed. We strongly believe that the benefits far outweigh the risks. Patient understands these risks and consented to starting treatment. Planned treatment duration is 10 years.  Return to  clinic in 3 months for survivorship care plan visit.    No orders of the defined types were placed in this encounter.  The patient has a good understanding of the overall plan. she agrees with it. she will call with any problems that may develop before the next visit here.   Harriette Ohara, MD 10/13/17

## 2017-10-13 NOTE — Assessment & Plan Note (Signed)
06/29/2017:Screening detected right breast mass at 2 o'clock position 0.7 cm, axillary ultrasound negative. Biopsy revealed IDC grade 1 ER 90%, PR 100%, Ki-67 5%, HER-2 negative ratio 1.19, T1 BN 0 stage I a clinical stage AJCC 8  07/25/2017: Right lumpectomy: IDC grade 2, 1 cm, margins negative, negative for lymphovascular or perineural invasion, 0/2 lymph nodes negative, ER 70%, PR 100%, HER-2 negative ratio 1.19, Ki-67 5%, T1 be N0 stage Ia Oncotype score 16: Distal recurrence risk at 9 years: 4% with antiestrogen therapy Adjuvant radiation therapy 08/30/2017 to 10/13/2017  Recommendation: Adjuvant antiestrogen therapy with tamoxifen 20 mg daily  We discussed the risks and benefits of tamoxifen. These include but not limited to insomnia, hot flashes, mood changes, vaginal dryness, and weight gain. Although rare, serious side effects including endometrial cancer, risk of blood clots were also discussed. We strongly believe that the benefits far outweigh the risks. Patient understands these risks and consented to starting treatment. Planned treatment duration is 10 years.  Return to clinic in 3 months for survivorship care plan visit.

## 2017-10-14 ENCOUNTER — Ambulatory Visit: Payer: 59

## 2017-10-18 NOTE — Progress Notes (Addendum)
  Radiation Oncology         [id number]) [id number] ________________________________  Name: Melissa [name] MRN: [id number]  Date: 10/13/2017  DOB: Nov 24, 1970  End of Treatment Note  Diagnosis:   47 y.o. female with StageIA, [id number] UIQ Invasive Ductal Carcinoma, ER(+)/ PR(+)/ Her2(-), Grade1    Indication for treatment:  Curative       Radiation treatment dates:    1. 08/29/2017 - 09/13/2017  2. 10/04/2017 - 10/13/2017  Site/dose:    1. Right Breast / 32.04 Gy in 12 fractions 2. Right Breast Boost / 10.68 Gy in 4 fractions followed by 10 Gy in 4 fractions  Beams/energy:    1. 3D / 10X Photon 2. En face / 12E, 15E Electron  Narrative: The patient tolerated radiation treatment relatively well.  However after 12 fractions, she reported increasing soreness to her right breast and axilla which were moderately erythematous. She developed a sudden fever on July 30th, and CBC showed leukocytosis. She was subsequently admitted to the hospital on August 1st to be treated for cellulitis and abscess to her right axilla/breast at the SLN biopsy site. She was on break from radiotherapy for 3 weeks before starting again on August 20th after responding well to IV antibiotics. At that time, the patient declined further whole breast radiotherapy and instead completed treatment with hypofractionated boost treatments to the lumpectomy cavity. She had no significant issues with the remainder of her treatment.  Plan: The patient has completed radiation treatment. The patient will return to radiation oncology clinic for routine followup in one month. I advised them to call or return sooner if they have any questions or concerns related to their recovery or treatment.  -----------------------------------  Eppie Gibson, MD  This document serves as a record of services personally performed by Eppie Gibson, MD. It was created on her behalf by Rae Lips, a trained medical scribe. The creation of  this record is based on the scribe's personal observations and the provider's statements to them. This document has been checked and approved by the attending provider.

## 2017-10-27 ENCOUNTER — Encounter: Payer: Self-pay | Admitting: Radiation Oncology

## 2017-11-03 ENCOUNTER — Encounter: Payer: Self-pay | Admitting: Radiation Oncology

## 2017-11-03 NOTE — Progress Notes (Signed)
Melissa [name] presents for follow up of radiation completed 10/13/17 to her Right Breast. She saw Dr. [name] on 10/13/17 and began Tamoxifen. She will see survivorship on 01/19/18. Her skin has healed well. She continues to use radiaplex twice daily as directed. She will switch to a vitamin E lotion or oil when the radiaplex is completed.   BP 116/85 (BP Location: Left Arm, Patient Position: Sitting)   Pulse 86   Temp 98.4 F (36.9 C) (Oral)   Resp 20   Ht 5\' 3"  (1.6 m)   Wt 184 lb (83.5 kg)   SpO2 100%   BMI 32.59 kg/m    Wt Readings from Last 3 Encounters:  11/11/17 184 lb (83.5 kg)  10/13/17 181 lb 12.8 oz (82.5 kg)  10/05/17 182 lb 9.6 oz (82.8 kg)

## 2017-11-11 ENCOUNTER — Encounter: Payer: Self-pay | Admitting: Radiation Oncology

## 2017-11-11 ENCOUNTER — Ambulatory Visit
Admission: RE | Admit: 2017-11-11 | Discharge: 2017-11-11 | Disposition: A | Discharge status: Home / Self Care | Payer: 59 | Source: Ambulatory Visit | Attending: Radiation Oncology | Admitting: Radiation Oncology

## 2017-11-11 ENCOUNTER — Other Ambulatory Visit: Payer: Self-pay

## 2017-11-11 VITALS — BP 116/85 | HR 86 | Temp 98.4°F | Resp 20 | Ht 63.0 in | Wt 184.0 lb

## 2017-11-11 DIAGNOSIS — C50211 Malignant neoplasm of upper-inner quadrant of right female breast: Secondary | ICD-10-CM | POA: Diagnosis present

## 2017-11-11 DIAGNOSIS — Z170 Estrogen receptor positive status [ER+]: Secondary | ICD-10-CM | POA: Insufficient documentation

## 2017-11-11 HISTORY — DX: Personal history of irradiation: Z92.3

## 2017-11-11 NOTE — Progress Notes (Signed)
Radiation Oncology         [phone] ________________________________  Name: Melissa [name] MRN: [id number]  Date: 11/11/2017  DOB: 1970-05-12  Follow-Up Visit Note  Outpatient  CC: [name], [name], MD  [name], MD  Diagnosis and Prior Radiotherapy:    ICD-10-CM   1. Malignant neoplasm of upper-inner quadrant of right breast in female, estrogen receptor positive ([place]) C50.211    Z17.0     47 y.o. female with StageIA, cT1bN0M0RightBreast UIQ Invasive Ductal Carcinoma, ER(+)/ PR(+)/ Her2(-), Grade1    08/29/2017 - 09/13/2017: Right Breast / 32.04 Gy in 12 fractions 10/04/2017 - 10/13/2017: Right Breast Boost / 10.68 Gy in 4 fractions followed by 10 Gy in 4 fractions  CHIEF COMPLAINT: Here for follow-up and surveillance of right breast cancer  Narrative:  The patient returns today for routine follow-up of radiation completed 10/13/2017 to her right breast. She saw Dr. [name] on 10/13/2017 and began Tamoxifen. She is scheduled to meet with survivorship on 01/19/2018. Her skin has healed well. She continues to use Rapid gel twice daily. She will switch to a vitamin E lotion or oil.                 ALLERGIES:  has No Known Allergies.  Meds: Current Outpatient Medications  Medication Sig Dispense Refill  . acetaminophen (TYLENOL) 500 MG tablet Take 1,000 mg by mouth every 6 (six) hours as needed for mild pain.     . fexofenadine (ALLEGRA) 180 MG tablet Take 180 mg by mouth daily as needed for allergies or rhinitis.    . Homeopathic Products (RAPID GEL RX) GEL Apply 1 application topically daily. Apply to right breast after radiation daily.    Marland Kitchen ibuprofen (ADVIL,MOTRIN) 200 MG tablet Take 600 mg by mouth every 6 (six) hours as needed for fever, headache, moderate pain or cramping.     . naproxen sodium (ALEVE) 220 MG tablet Take 220 mg by mouth daily as needed (pain).     . tamoxifen (NOLVADEX) 20 MG tablet Take 1 tablet (20 mg total) by mouth daily. 90 tablet 3   No  current facility-administered medications for this encounter.     Physical Findings: The patient is in no acute distress. Patient is alert and oriented.  height is 5' 3" (1.6 m) and weight is 184 lb (83.5 kg). Her oral temperature is 98.4 F (36.9 C). Her blood pressure is 116/85 and her pulse is 86. Her respiration is 20 and oxygen saturation is 100%. .    Satisfactory skin healing in radiotherapy fields. Still has a little pinkness to the right breast - mostly in the lumpectomy boost site. Skin is intact.    Lab Findings: Lab Results  Component Value Date   WBC 13.7 (H) 09/26/2017   HGB 11.4 (L) 09/26/2017   HCT 37.6 09/26/2017   MCV 88.3 09/26/2017   PLT 360 09/26/2017    Radiographic Findings: No results found.  Impression/Plan: Healing well from radiotherapy to the breast tissue.  Continue skin care with topical Vitamin E Oil and / or lotion for at least 2 more months for further healing.  I encouraged her to continue with yearly mammograph and followup with medical oncology. I will see her back on an as-needed basis. I have encouraged her to call if she has any issues or concerns in the future. I wished her the very best.   _____________________________________   Eppie Gibson, MD   This document serves as a record of services personally  performed by Sarah Squire, MD. It was created on her behalf by Elizabeth Ashley, a trained medical scribe. The creation of this record is based on the scribe's personal observations and the provider's statements to them. This document has been checked and approved by the attending provider.  

## 2018-01-11 ENCOUNTER — Telehealth: Payer: Self-pay

## 2018-01-11 NOTE — Telephone Encounter (Signed)
LVM for patient reminding of SCP visit with NP on 01/19/18 at 10 am.  Center number left for callback.

## 2018-01-19 ENCOUNTER — Telehealth: Payer: Self-pay | Admitting: Hematology and Oncology

## 2018-01-19 ENCOUNTER — Inpatient Hospital Stay: Payer: 59 | Attending: Hematology and Oncology | Admitting: Adult Health

## 2018-01-19 ENCOUNTER — Encounter: Payer: Self-pay | Admitting: Adult Health

## 2018-01-19 VITALS — BP 134/70 | HR 83 | Temp 98.8°F | Resp 20 | Ht 63.0 in | Wt 183.3 lb

## 2018-01-19 DIAGNOSIS — Z79810 Long term (current) use of selective estrogen receptor modulators (SERMs): Secondary | ICD-10-CM | POA: Diagnosis not present

## 2018-01-19 DIAGNOSIS — M7989 Other specified soft tissue disorders: Secondary | ICD-10-CM | POA: Diagnosis not present

## 2018-01-19 DIAGNOSIS — Z79899 Other long term (current) drug therapy: Secondary | ICD-10-CM | POA: Diagnosis not present

## 2018-01-19 DIAGNOSIS — Z170 Estrogen receptor positive status [ER+]: Secondary | ICD-10-CM | POA: Insufficient documentation

## 2018-01-19 DIAGNOSIS — Z923 Personal history of irradiation: Secondary | ICD-10-CM | POA: Diagnosis not present

## 2018-01-19 DIAGNOSIS — Z1211 Encounter for screening for malignant neoplasm of colon: Secondary | ICD-10-CM

## 2018-01-19 DIAGNOSIS — Z8042 Family history of malignant neoplasm of prostate: Secondary | ICD-10-CM | POA: Insufficient documentation

## 2018-01-19 DIAGNOSIS — C50211 Malignant neoplasm of upper-inner quadrant of right female breast: Secondary | ICD-10-CM | POA: Insufficient documentation

## 2018-01-19 NOTE — Progress Notes (Signed)
CLINIC:  Survivorship   REASON FOR VISIT:  Routine follow-up post-treatment for a recent history of breast cancer.  BRIEF ONCOLOGIC HISTORY:    Malignant neoplasm of upper-inner quadrant of right breast in female, estrogen receptor positive ([place])   06/29/2017 Initial Diagnosis    Screening detected right breast mass at 2 o'clock position 0.7 cm, axillary ultrasound negative.  Biopsy revealed IDC grade 1 ER 90%, PR 100%, Ki-67 5%, HER-2 negative ratio 1.19, T1 BN 0 stage I a clinical stage AJCC 8    07/13/2017 Genetic Testing    Negative genetic testing on the 9 gene STAT panel.  The STAT Breast cancer panel offered by [facility] includes sequencing and rearrangement analysis for the following 9 genes:  ATM, BRCA1, BRCA2, CDH1, CHEK2, PALB2, PTEN, STK11 and TP53.   The report date is Jul 13, 2017.  The Multi-Gene Panel offered by [facility] includes sequencing and/or deletion duplication testing of the following 83 genes: ALK, APC, ATM, AXIN2,BAP1,  BARD1, BLM, BMPR1A, BRCA1, BRCA2, BRIP1, CASR, CDC73, CDH1, CDK4, CDKN1B, CDKN1C, CDKN2A (p14ARF), CDKN2A (p16INK4a), CEBPA, CHEK2, CTNNA1, DICER1, DIS3L2, EGFR (c.2369C>T, p.Thr790Met variant only), EPCAM (Deletion/duplication testing only), FH, FLCN, GATA2, GPC3, GREM1 (Promoter region deletion/duplication testing only), HOXB13 (c.251G>A, p.Gly84Glu), HRAS, KIT, MAX, MEN1, MET, MITF (c.952G>A, p.Glu318Lys variant only), MLH1, MSH2, MSH3, MSH6, MUTYH, NBN, NF1, NF2, NTHL1, PALB2, PDGFRA, PHOX2B, PMS2, POLD1, POLE, POT1, PRKAR1A, PTCH1, PTEN, RAD50, RAD51C, RAD51D, RB1, RECQL4, RET, RUNX1, SDHAF2, SDHA (sequence changes only), SDHB, SDHC, SDHD, SMAD4, SMARCA4, SMARCB1, SMARCE1, STK11, SUFU, TERT, TERT, TMEM127, TP53, TSC1, TSC2, VHL, WRN and WT1.   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  The report date is Jul 13, 2017.    07/25/2017 Surgery    Right lumpectomy: IDC grade 2, 1 cm, margins negative, negative for lymphovascular or  perineural invasion, 0/2 lymph nodes negative, ER 70%, PR 100%, HER-2 negative ratio 1.19, Ki-67 5%, T1 be N0 stage Ia    08/12/2017 Oncotype testing    Oncotype score 16: Distal recurrence risk at 9 years: 4% with antiestrogen therapy    08/30/2017 - 10/13/2017 Radiation Therapy    Adjuvant radiation    10/2017 -  Anti-estrogen oral therapy    Tamoxifen daily     INTERVAL HISTORY:  Melissa [name] presents to the [facility] today for our initial meeting to review her survivorship care plan detailing her treatment course for breast cancer, as well as monitoring long-term side effects of that treatment, education regarding health maintenance, screening, and overall wellness and health promotion.     Overall, Melissa [name] reports feeling well.  She is taking Tamoxifen at night and hasn't noted significant issues.  She doesn't note increase in hot flashes, vaginal discharge, or arthralgias.  She is still menstruating.    Melissa [name] notes that her incision seems slightly harder and wants me to evaluate it.      REVIEW OF SYSTEMS:  Review of Systems  Constitutional: Negative for appetite change, chills, fatigue, fever and unexpected weight change.  HENT:   Negative for hearing loss, lump/mass, tinnitus and trouble swallowing.   Eyes: Negative for eye problems and icterus.  Respiratory: Negative for chest tightness and cough.   Cardiovascular: Negative for chest pain, leg swelling and palpitations.  Gastrointestinal: Negative for abdominal distention, abdominal pain, constipation, diarrhea, nausea and vomiting.  Endocrine: Negative for hot flashes.  Genitourinary: Negative for menstrual problem.   Musculoskeletal: Negative for arthralgias.  Skin: Negative for itching and rash.  Neurological: Negative for dizziness, extremity weakness,  headaches and numbness.  Hematological: Negative for adenopathy. Does not bruise/bleed easily.  Psychiatric/Behavioral: Negative for depression. The  patient is not nervous/anxious.   Breast: Denies any new nodularity, masses, tenderness, nipple changes, or nipple discharge.      ONCOLOGY TREATMENT TEAM:  1. Surgeon:  Dr. Lucia Gaskins at [facility] 2. Medical Oncologist: Dr. [name]  3. Radiation Oncologist: Dr. Isidore Moos    PAST MEDICAL/SURGICAL HISTORY:  Past Medical History:  Diagnosis Date  . Cancer ([place]) 06/2017   right breast cancer  . Family history of prostate cancer   . History of radiation therapy 08/29/17- 09/13/17 and 10/04/17- 10/13/17   right breast/ 32.04 Gy in 12 fractions, right breast boost/ 10.68 Gy in 4 fractions followed by 10 gy in 4 fractions. Break in treatment due to cellulitis and abscess to her biopsy site.    Past Surgical History:  Procedure Laterality Date  . BREAST BIOPSY Right 06/2017  . BREAST LUMPECTOMY WITH RADIOACTIVE SEED AND SENTINEL LYMPH NODE BIOPSY Right 07/25/2017   Procedure: RIGHT BREAST LUMPECTOMY WITH RADIOACTIVE SEED AND SENTINEL LYMPH NODE BIOPSY;  Surgeon: Alphonsa Overall, MD;  Location: [facility];  Service: General;  Laterality: Right;  . CESAREAN SECTION  2006  . LAPAROSCOPIC CHOLECYSTECTOMY  2010     ALLERGIES:  No Known Allergies   CURRENT MEDICATIONS:  Outpatient Encounter Medications as of 01/19/2018  Medication Sig  . acetaminophen (TYLENOL) 500 MG tablet Take 1,000 mg by mouth every 6 (six) hours as needed for mild pain.   . fexofenadine (ALLEGRA) 180 MG tablet Take 180 mg by mouth daily as needed for allergies or rhinitis.  Marland Kitchen ibuprofen (ADVIL,MOTRIN) 200 MG tablet Take 600 mg by mouth every 6 (six) hours as needed for fever, headache, moderate pain or cramping.   . naproxen sodium (ALEVE) 220 MG tablet Take 220 mg by mouth daily as needed (pain).   . tamoxifen (NOLVADEX) 20 MG tablet Take 1 tablet (20 mg total) by mouth daily.  . Homeopathic Products (RAPID GEL RX) GEL Apply 1 application topically daily. Apply to right breast after radiation daily.    No facility-administered encounter medications on file as of 01/19/2018.      ONCOLOGIC FAMILY HISTORY:  Family History  Problem Relation Age of Onset  . Heart attack Mother 38       d. 36  . Diabetes Mother   . Hypertension Mother   . Prostate cancer Father   . Diabetes Father   . Kidney failure Father   . Hypertension Father   . Lung cancer Paternal Uncle        heavy smoker  . Arthritis Paternal Grandmother   . Osteoporosis Paternal Grandmother   . Diabetes Paternal Grandfather   . Kidney failure Paternal Grandfather   . Cancer Brother        stomach/colon cancer, d. 35; smoker     GENETIC COUNSELING/TESTING: See above  SOCIAL HISTORY:  Social History   Socioeconomic History  . Marital status: Married    Spouse name: [name]  . Number of children: 1  . Years of education: 25  . Highest education level: Not on file  Occupational History  . Not on file  Social Needs  . Financial resource strain: Not hard at all  . Food insecurity:    Worry: Never true    Inability: Never true  . Transportation needs:    Medical: No    Non-medical: No  Tobacco Use  . Smoking status: Never Smoker  .  Smokeless tobacco: Never Used  Substance and Sexual Activity  . Alcohol use: Yes    Alcohol/week: 1.0 standard drinks    Types: 1 Glasses of wine per week  . Drug use: Never  . Sexual activity: Yes    Birth control/protection: Condom    Comment: stopped BCP 07-04-17  Lifestyle  . Physical activity:    Days per week: 3 days    Minutes per session: Not on file  . Stress: Only a little  Relationships  . Social connections:    Talks on phone: Not on file    Gets together: Not on file    Attends religious service: Not on file    Active member of club or organization: Not on file    Attends meetings of clubs or organizations: Not on file    Relationship status: Not on file  . Intimate partner violence:    Fear of current or ex partner: No    Emotionally abused: No     Physically abused: No    Forced sexual activity: No  Other Topics Concern  . Not on file  Social History Narrative  . Not on file      PHYSICAL EXAMINATION:  Vital Signs:   Vitals:   01/19/18 0950  BP: 134/70  Pulse: 83  Resp: 20  Temp: 98.8 F (37.1 C)  SpO2: 99%   Filed Weights   01/19/18 0950  Weight: 183 lb 4.8 oz (83.1 kg)   General: Well-nourished, well-appearing female in no acute distress.  She is unaccompanied today.   HEENT: Head is normocephalic.  Pupils equal and reactive to light. Conjunctivae clear without exudate.  Sclerae anicteric. Oral mucosa is pink, moist.  Oropharynx is pink without lesions or erythema.  Lymph: No cervical, supraclavicular, or infraclavicular lymphadenopathy noted on palpation.  Cardiovascular: Regular rate and rhythm.Marland Kitchen Respiratory: Clear to auscultation bilaterally. Chest expansion symmetric; breathing non-labored.  Breasts: right breast s/p lumpectomy and radiation, her breast is erythematous.  There is mild amt of scar tissue at her lumpectomy site, no nodularity or concern for recurrence.  Fine erythematous rash over right breast (patient notes trying new oil over right breast) GI: Abdomen soft and round; non-tender, non-distended. Bowel sounds normoactive.  GU: Deferred.  Neuro: No focal deficits. Steady gait.  Psych: Mood and affect normal and appropriate for situation.  Extremities: perhaps maybe very faint swelling in right forearm, otherwise normal thorughout MSK: No focal spinal tenderness to palpation.  Full range of motion in bilateral upper extremities Skin: Warm and dry.  LABORATORY DATA:  None for this visit.  DIAGNOSTIC IMAGING:  None for this visit.      ASSESSMENT AND PLAN:  Melissa [name] is a pleasant 47 y.o. female with Stage IA right breast invasive ductal carcinoma, ER+/PR+/HER2-, diagnosed in 06/2017, treated with lumpectomy, adjuvant radiation therapy, and anti-estrogen therapy with Tamoxifen beginning in  10/2017.  She presents to the Survivorship Clinic for our initial meeting and routine follow-up post-completion of treatment for breast cancer.    1. Stage IA right breast cancer:  Melissa [name] is continuing to recover from definitive treatment for breast cancer. She will follow-up with her medical oncologist, Dr. [name] in 6 months with history and physical exam per surveillance protocol.  She will continue her anti-estrogen therapy with Tamoxifen. Thus far, she is tolerating the Tamoxifen well, with minimal side effects. Today, a comprehensive survivorship care plan and treatment summary was reviewed with the patient today detailing her breast cancer diagnosis, treatment course, potential  late/long-term effects of treatment, appropriate follow-up care with recommendations for the future, and patient education resources.  A copy of this summary, along with a letter will be sent to the patient's primary care provider via mail/fax/In Basket message after today's visit.    2. Intermittent right arm swelling: Hardly notable swelling in right forearm noted today. I recommended she continue to wear sleeve.  Should it worsen in any way whatsoever will order doppler and refer to PT.    3. Bone health:  Given Melissa [name]'s history of breast cancer, she is at slight risk for bone demineralization.  I counseled her that tamoxifen has a protective effective on her bones. She was given education on specific activities to promote bone health.  4. Cancer screening:  Due to Melissa [name]'s history and her age, she should receive screening for skin cancers, colon cancer, and gynecologic cancers.  The information and recommendations are listed on the patient's comprehensive care plan/treatment summary and were reviewed in detail with the patient.  She was referred to Dr. Silverio Decamp in GI for colonoscopy today.    5. Health maintenance and wellness promotion: Melissa [name] was encouraged to consume 5-7 servings of fruits  and vegetables per day. We reviewed the "Nutrition Rainbow" handout, as well as the handout "Take Control of Your Health and Reduce Your Cancer Risk" from the [place].  She was also encouraged to engage in moderate to vigorous exercise for 30 minutes per day most days of the week. We discussed the [employer] fitness program, which is designed for cancer survivors to help them become more physically fit after cancer treatments.  She was instructed to limit her alcohol consumption and continue to abstain from tobacco use.     6. Support services/counseling: It is not uncommon for this period of the patient's cancer care trajectory to be one of many emotions and stressors.  We discussed an opportunity for her to participate in the next session of [facility] ("Finding Your New Normal") support group series designed for patients after they have completed treatment.   Melissa [name] was encouraged to take advantage of our many other support services programs, support groups, and/or counseling in coping with her new life as a cancer survivor after completing anti-cancer treatment.  She was offered support today through active listening and expressive supportive counseling.  She was given information regarding our available services and encouraged to contact me with any questions or for help enrolling in any of our support group/programs.    Dispo:   -Return to cancer center in 6 months for f/u with Dr. [name] -Referral to GI -Mammogram due in 05/2018 -Follow up with surgery 02/2018 -She is welcome to return back to the Survivorship Clinic at any time; no additional follow-up needed at this time.  -Consider referral back to survivorship as a long-term survivor for continued surveillance  A total of (30) minutes of face-to-face time was spent with this patient with greater than 50% of that time in counseling and care-coordination.   Gardenia Phlegm, NP Survivorship Program [facility] [phone]   Note: PRIMARY CARE PROVIDER Aretta Nip, [place] [phone]

## 2018-01-19 NOTE — Telephone Encounter (Signed)
Gave patient avs and calendar.   °

## 2018-06-22 ENCOUNTER — Other Ambulatory Visit: Payer: Self-pay

## 2018-06-22 ENCOUNTER — Ambulatory Visit
Admission: RE | Admit: 2018-06-22 | Discharge: 2018-06-22 | Disposition: A | Discharge status: Home / Self Care | Payer: 59 | Source: Ambulatory Visit | Attending: Adult Health | Admitting: Adult Health

## 2018-06-22 DIAGNOSIS — Z170 Estrogen receptor positive status [ER+]: Principal | ICD-10-CM

## 2018-06-22 DIAGNOSIS — C50211 Malignant neoplasm of upper-inner quadrant of right female breast: Secondary | ICD-10-CM

## 2018-06-22 HISTORY — DX: Personal history of irradiation: Z92.3

## 2018-07-13 NOTE — Assessment & Plan Note (Signed)
06/29/2017:Screening detected right breast mass at 2 o'clock position 0.7 cm, axillary ultrasound negative. Biopsy revealed IDC grade 1 ER 90%, PR 100%, Ki-67 5%, HER-2 negative ratio 1.19, T1 BN 0 stage I a clinical stage AJCC 8  07/25/2017:Right lumpectomy: IDC grade 2, 1 cm, margins negative, negative for lymphovascular or perineural invasion, 0/2 lymph nodes negative, ER 70%, PR 100%, HER-2 negative ratio 1.19, Ki-67 5%, T1 be N0 stage Ia Oncotype score 16: Distal recurrence risk at 9 years: 4% with antiestrogen therapy Adjuvant radiation therapy 08/30/2017 to 10/13/2017  Current treatment: Adjuvant antiestrogen therapy with tamoxifen 20 mg daily started 10/14/2018 Tamoxifen toxicities:  Breast cancer surveillance: 06/22/2018 mammogram: Postsurgical changes, benign, breast density category B  Return to clinic in 1 year for follow-up

## 2018-07-17 ENCOUNTER — Telehealth: Payer: Self-pay | Admitting: Hematology and Oncology

## 2018-07-17 NOTE — Telephone Encounter (Signed)
Called patient regarding upcoming Webex appointment, patient is notified and e-mail has been sent. °

## 2018-07-19 NOTE — Progress Notes (Signed)
HEMATOLOGY-ONCOLOGY [place] VISIT PROGRESS NOTE  I connected with Melissa [name] on 07/20/2018 at 11:15 AM EDT by Webex video conference and verified that I am speaking with the correct person using two identifiers.  I discussed the limitations, risks, security and privacy concerns of performing an evaluation and management service by Webex and the availability of in person appointments.  I also discussed with the patient that there may be a patient responsible charge related to this service. The patient expressed understanding and agreed to proceed.   Patient's Location: Home Physician Location: Clinic  CHIEF COMPLIANT: Follow-up of tamoxifen  INTERVAL HISTORY: Melissa [name] is a 48 y.o. female with above-mentioned history of right breast cancer treated with lumpectomy and radiation who is currently on tamoxifen. I last saw her 10 months ago. In the interim she was seen by Wilber Bihari, NP for [facility]. Her most recent mammogram on 06/22/18 showed no evidence of malignancy bilaterally. She presents over Webex today for annual follow-up.     Malignant neoplasm of upper-inner quadrant of right breast in female, estrogen receptor positive ([place])   06/29/2017 Initial Diagnosis    Screening detected right breast mass at 2 o'clock position 0.7 cm, axillary ultrasound negative.  Biopsy revealed IDC grade 1 ER 90%, PR 100%, Ki-67 5%, HER-2 negative ratio 1.19, T1 BN 0 stage I a clinical stage AJCC 8    07/13/2017 Genetic Testing    Negative genetic testing on the 9 gene STAT panel.  The STAT Breast cancer panel offered by [facility] includes sequencing and rearrangement analysis for the following 9 genes:  ATM, BRCA1, BRCA2, CDH1, CHEK2, PALB2, PTEN, STK11 and TP53.   The report date is Jul 13, 2017.  The Multi-Gene Panel offered by [facility] includes sequencing and/or deletion duplication testing of the following 83 genes: ALK, APC, ATM, AXIN2,BAP1,  BARD1, BLM, BMPR1A, BRCA1, BRCA2, BRIP1, CASR,  CDC73, CDH1, CDK4, CDKN1B, CDKN1C, CDKN2A (p14ARF), CDKN2A (p16INK4a), CEBPA, CHEK2, CTNNA1, DICER1, DIS3L2, EGFR (c.2369C>T, p.Thr790Met variant only), EPCAM (Deletion/duplication testing only), FH, FLCN, GATA2, GPC3, GREM1 (Promoter region deletion/duplication testing only), HOXB13 (c.251G>A, p.Gly84Glu), HRAS, KIT, MAX, MEN1, MET, MITF (c.952G>A, p.Glu318Lys variant only), MLH1, MSH2, MSH3, MSH6, MUTYH, NBN, NF1, NF2, NTHL1, PALB2, PDGFRA, PHOX2B, PMS2, POLD1, POLE, POT1, PRKAR1A, PTCH1, PTEN, RAD50, RAD51C, RAD51D, RB1, RECQL4, RET, RUNX1, SDHAF2, SDHA (sequence changes only), SDHB, SDHC, SDHD, SMAD4, SMARCA4, SMARCB1, SMARCE1, STK11, SUFU, TERT, TERT, TMEM127, TP53, TSC1, TSC2, VHL, WRN and WT1.   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  The report date is Jul 13, 2017.    07/25/2017 Surgery    Right lumpectomy: IDC grade 2, 1 cm, margins negative, negative for lymphovascular or perineural invasion, 0/2 lymph nodes negative, ER 70%, PR 100%, HER-2 negative ratio 1.19, Ki-67 5%, T1 be N0 stage Ia    08/12/2017 Oncotype testing    Oncotype score 16: Distal recurrence risk at 9 years: 4% with antiestrogen therapy    08/30/2017 - 10/13/2017 Radiation Therapy    Adjuvant radiation    10/2017 -  Anti-estrogen oral therapy    Tamoxifen daily     REVIEW OF SYSTEMS:   Constitutional: Denies fevers, chills or abnormal weight loss Eyes: Denies blurriness of vision Ears, nose, mouth, throat, and face: Denies mucositis or sore throat Respiratory: Denies cough, dyspnea or wheezes Cardiovascular: Denies palpitation, chest discomfort Gastrointestinal:  Denies nausea, heartburn or change in bowel habits Skin: Denies abnormal skin rashes Lymphatics: Denies new lymphadenopathy or easy bruising Neurological:Denies numbness, tingling or new weaknesses Behavioral/Psych: Mood is  stable, no new changes  Extremities: No lower extremity edema Breast: denies any pain or lumps or nodules in  either breasts All other systems were reviewed with the patient and are negative.  Observations/Objective:  There were no vitals filed for this visit. There is no height or weight on file to calculate BMI.  I have reviewed the data as listed CMP Latest Ref Rng & Units 09/16/2017 09/14/2017 07/06/2017  Glucose 70 - 99 mg/dL 90 163(H) 103  BUN 6 - 20 mg/dL 5(L) 6 10  Creatinine 0.44 - 1.00 mg/dL 0.76 0.81 0.90  Sodium 135 - 145 mmol/L 142 140 142  Potassium 3.5 - 5.1 mmol/L 3.9 3.5 4.4  Chloride 98 - 111 mmol/L 109 108 109  CO2 22 - 32 mmol/L '24 23 22  '$ Calcium 8.9 - 10.3 mg/dL 8.4(L) 8.6(L) 8.9  Total Protein 6.5 - 8.1 g/dL - 6.6 6.8  Total Bilirubin 0.3 - 1.2 mg/dL - 0.8 0.4  Alkaline Phos 38 - 126 U/L - 86 81  AST 15 - 41 U/L - 24 25  ALT 0 - 44 U/L - 23 25    Lab Results  Component Value Date   WBC 13.7 (H) 09/26/2017   HGB 11.4 (L) 09/26/2017   HCT 37.6 09/26/2017   MCV 88.3 09/26/2017   PLT 360 09/26/2017   NEUTROABS 9.7 (H) 09/24/2017      Assessment Plan:  Malignant neoplasm of upper-inner quadrant of right breast in female, estrogen receptor positive ([place]) 06/29/2017:Screening detected right breast mass at 2 o'clock position 0.7 cm, axillary ultrasound negative. Biopsy revealed IDC grade 1 ER 90%, PR 100%, Ki-67 5%, HER-2 negative ratio 1.19, T1 BN 0 stage I a clinical stage AJCC 8  07/25/2017:Right lumpectomy: IDC grade 2, 1 cm, margins negative, negative for lymphovascular or perineural invasion, 0/2 lymph nodes negative, ER 70%, PR 100%, HER-2 negative ratio 1.19, Ki-67 5%, T1 be N0 stage Ia Oncotype score 16: Distal recurrence risk at 9 years: 4% with antiestrogen therapy Adjuvant radiation therapy 08/30/2017 to 10/13/2017  Current treatment: Adjuvant antiestrogen therapy with tamoxifen 20 mg daily started 10/13/2017  Tamoxifen toxicities: Slight drowsiness: taking at bed time Dry mouth Occ Hot flashes  Breast cancer surveillance: 06/22/2018 mammogram: Postsurgical  changes, benign, breast density category B  Return to clinic in 1 year for follow-up  I discussed the assessment and treatment plan with the patient. The patient was provided an opportunity to ask questions and all were answered. The patient agreed with the plan and demonstrated an understanding of the instructions. The patient was advised to call back or seek an in-person evaluation if the symptoms worsen or if the condition fails to improve as anticipated.   I provided 15 minutes of face-to-face Web Ex time during this encounter.    Rulon Eisenmenger, MD 07/20/2018   I, Molly Dorshimer, am acting as scribe for [name], MD.  I have reviewed the above documentation for accuracy and completeness, and I agree with the above.

## 2018-07-20 ENCOUNTER — Inpatient Hospital Stay: Payer: 59 | Attending: Hematology and Oncology | Admitting: Hematology and Oncology

## 2018-07-20 DIAGNOSIS — C50211 Malignant neoplasm of upper-inner quadrant of right female breast: Secondary | ICD-10-CM

## 2018-07-20 DIAGNOSIS — Z170 Estrogen receptor positive status [ER+]: Secondary | ICD-10-CM | POA: Diagnosis not present

## 2018-07-20 DIAGNOSIS — Z923 Personal history of irradiation: Secondary | ICD-10-CM | POA: Diagnosis not present

## 2018-07-20 DIAGNOSIS — Z79810 Long term (current) use of selective estrogen receptor modulators (SERMs): Secondary | ICD-10-CM | POA: Diagnosis not present

## 2018-07-20 MED ORDER — TAMOXIFEN CITRATE 20 MG PO TABS: 20.0000 mg | ORAL_TABLET | Freq: Every day | ORAL | 3 refills | Status: DC

## 2018-07-21 ENCOUNTER — Telehealth: Payer: Self-pay | Admitting: Hematology and Oncology

## 2018-07-21 NOTE — Telephone Encounter (Signed)
I left a message regarding schedule  

## 2018-11-23 ENCOUNTER — Other Ambulatory Visit: Payer: Self-pay

## 2018-11-23 DIAGNOSIS — Z20822 Contact with and (suspected) exposure to covid-19: Secondary | ICD-10-CM

## 2018-11-24 LAB — NOVEL CORONAVIRUS, NAA: SARS-CoV-2, NAA: NOT DETECTED

## 2019-05-24 ENCOUNTER — Ambulatory Visit: Payer: 59 | Attending: Internal Medicine

## 2019-05-24 DIAGNOSIS — Z23 Encounter for immunization: Secondary | ICD-10-CM

## 2019-05-24 NOTE — Progress Notes (Signed)
   Covid-19 Vaccination Clinic  Name:  Melissa [name]    MRN: [id number] DOB: 08-18-1970  05/24/2019  [name] was observed post Covid-19 immunization for 15 minutes without incident. She was provided with Vaccine Information Sheet and instruction to access the V-Safe system.   [name] was instructed to call 911 with any severe reactions post vaccine: Marland Kitchen Difficulty breathing  . Swelling of face and throat  . A fast heartbeat  . A bad rash all over body  . Dizziness and weakness   Immunizations Administered    Name Date Dose VIS Date Route   Pfizer COVID-19 Vaccine 05/24/2019  9:22 AM 0.3 mL 01/26/2019 Intramuscular   Manufacturer: [employer]   Lot: [id number]   [place]: [id number]

## 2019-06-18 ENCOUNTER — Ambulatory Visit: Payer: 59 | Attending: Internal Medicine

## 2019-06-18 DIAGNOSIS — Z23 Encounter for immunization: Secondary | ICD-10-CM

## 2019-06-18 NOTE — Progress Notes (Signed)
   Covid-19 Vaccination Clinic  Name:  Shaquona Cady    MRN: [id number] DOB: 25-Feb-1970  06/18/2019  Ms. Finefrock was observed post Covid-19 immunization for 15 minutes without incident. She was provided with Vaccine Information Sheet and instruction to access the V-Safe system.   Ms. Farless was instructed to call 911 with any severe reactions post vaccine: Marland Kitchen Difficulty breathing  . Swelling of face and throat  . A fast heartbeat  . A bad rash all over body  . Dizziness and weakness   Immunizations Administered    Name Date Dose VIS Date Route   Pfizer COVID-19 Vaccine 06/18/2019 10:10 AM 0.3 mL 04/11/2018 Intramuscular   Manufacturer: [place]   Lot: [id number]   [place]: [id number]

## 2019-07-05 ENCOUNTER — Other Ambulatory Visit: Payer: Self-pay | Admitting: Adult Health

## 2019-07-05 DIAGNOSIS — Z853 Personal history of malignant neoplasm of breast: Secondary | ICD-10-CM

## 2019-07-11 ENCOUNTER — Telehealth: Payer: Self-pay | Admitting: Hematology and Oncology

## 2019-07-11 NOTE — Telephone Encounter (Signed)
R/s appt per [name] PAL - unable to reach pt .left message for patient with new appt date and time / r/s 6/14 ro 6/24

## 2019-07-30 ENCOUNTER — Telehealth: Payer: 59 | Admitting: Hematology and Oncology

## 2019-08-07 ENCOUNTER — Ambulatory Visit
Admission: RE | Admit: 2019-08-07 | Discharge: 2019-08-07 | Disposition: A | Discharge status: Home / Self Care | Payer: 59 | Source: Ambulatory Visit | Attending: Adult Health | Admitting: Adult Health

## 2019-08-07 ENCOUNTER — Other Ambulatory Visit: Payer: Self-pay

## 2019-08-07 DIAGNOSIS — Z853 Personal history of malignant neoplasm of breast: Secondary | ICD-10-CM

## 2019-08-09 ENCOUNTER — Telehealth: Payer: 59 | Admitting: Hematology and Oncology

## 2019-08-12 NOTE — Progress Notes (Signed)
HEMATOLOGY-ONCOLOGY MYCHART VIDEO VISIT PROGRESS NOTE  I connected with Melissa [name] on 08/13/2019 at 11:15 AM EDT by MyChart video conference and verified that I am speaking with the correct person using two identifiers.  I discussed the limitations, risks, security and privacy concerns of performing an evaluation and management service by MyChart and the availability of in person appointments.  I also discussed with the patient that there may be a patient responsible charge related to this service. The patient expressed understanding and agreed to proceed.  Patient's Location: Home Physician Location: Clinic  CHIEF COMPLIANT: Follow-up of right breast cancer on tamoxifen  INTERVAL HISTORY: Melissa [name] is a 49 y.o. female with above-mentioned history of right breast cancer treated with lumpectomy and radiation, who is currently on tamoxifen. Mammogram on 08/07/19 showed no evidence of malignancy bilaterally. She presents over MyChart today for annual follow-up.   Oncology History  Malignant neoplasm of upper-inner quadrant of right breast in female, estrogen receptor positive ([place])  06/29/2017 Initial Diagnosis   Screening detected right breast mass at 2 o'clock position 0.7 cm, axillary ultrasound negative.  Biopsy revealed IDC grade 1 ER 90%, PR 100%, Ki-67 5%, HER-2 negative ratio 1.19, T1 BN 0 stage I a clinical stage AJCC 8   07/13/2017 Genetic Testing   Negative genetic testing on the 9 gene STAT panel.  The STAT Breast cancer panel offered by [facility] includes sequencing and rearrangement analysis for the following 9 genes:  ATM, BRCA1, BRCA2, CDH1, CHEK2, PALB2, PTEN, STK11 and TP53.   The report date is Jul 13, 2017.  The Multi-Gene Panel offered by [facility] includes sequencing and/or deletion duplication testing of the following 83 genes: ALK, APC, ATM, AXIN2,BAP1,  BARD1, BLM, BMPR1A, BRCA1, BRCA2, BRIP1, CASR, CDC73, CDH1, CDK4, CDKN1B, CDKN1C, CDKN2A (p14ARF), CDKN2A (p16INK4a),  CEBPA, CHEK2, CTNNA1, DICER1, DIS3L2, EGFR (c.2369C>T, p.Thr790Met variant only), EPCAM (Deletion/duplication testing only), FH, FLCN, GATA2, GPC3, GREM1 (Promoter region deletion/duplication testing only), HOXB13 (c.251G>A, p.Gly84Glu), HRAS, KIT, MAX, MEN1, MET, MITF (c.952G>A, p.Glu318Lys variant only), MLH1, MSH2, MSH3, MSH6, MUTYH, NBN, NF1, NF2, NTHL1, PALB2, PDGFRA, PHOX2B, PMS2, POLD1, POLE, POT1, PRKAR1A, PTCH1, PTEN, RAD50, RAD51C, RAD51D, RB1, RECQL4, RET, RUNX1, SDHAF2, SDHA (sequence changes only), SDHB, SDHC, SDHD, SMAD4, SMARCA4, SMARCB1, SMARCE1, STK11, SUFU, TERT, TERT, TMEM127, TP53, TSC1, TSC2, VHL, WRN and WT1.   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  The report date is Jul 13, 2017.   07/25/2017 Surgery   Right lumpectomy: IDC grade 2, 1 cm, margins negative, negative for lymphovascular or perineural invasion, 0/2 lymph nodes negative, ER 70%, PR 100%, HER-2 negative ratio 1.19, Ki-67 5%, T1 be N0 stage Ia   08/12/2017 Oncotype testing   Oncotype score 16: Distal recurrence risk at 9 years: 4% with antiestrogen therapy   08/30/2017 - 10/13/2017 Radiation Therapy   Adjuvant radiation   10/2017 -  Anti-estrogen oral therapy   Tamoxifen daily     Observations/Objective:  There were no vitals filed for this visit. There is no height or weight on file to calculate BMI.  I have reviewed the data as listed CMP Latest Ref Rng & Units 09/16/2017 09/14/2017 07/06/2017  Glucose 70 - 99 mg/dL 90 163(H) 103  BUN 6 - 20 mg/dL 5(L) 6 10  Creatinine 0.44 - 1.00 mg/dL 0.76 0.81 0.90  Sodium 135 - 145 mmol/L 142 140 142  Potassium 3.5 - 5.1 mmol/L 3.9 3.5 4.4  Chloride 98 - 111 mmol/L 109 108 109  CO2 22 - 32 mmol/L 24 23 22  Calcium 8.9 - 10.3 mg/dL 8.4(L) 8.6(L) 8.9  Total Protein 6.5 - 8.1 g/dL - 6.6 6.8  Total Bilirubin 0.3 - 1.2 mg/dL - 0.8 0.4  Alkaline Phos 38 - 126 U/L - 86 81  AST 15 - 41 U/L - 24 25  ALT 0 - 44 U/L - 23 25    Lab Results  Component  Value Date   WBC 13.7 (H) 09/26/2017   HGB 11.4 (L) 09/26/2017   HCT 37.6 09/26/2017   MCV 88.3 09/26/2017   PLT 360 09/26/2017   NEUTROABS 9.7 (H) 09/24/2017      Assessment Plan:  Malignant neoplasm of upper-inner quadrant of right breast in female, estrogen receptor positive ([place]) 06/29/2017:Screening detected right breast mass at 2 o'clock position 0.7 cm, axillary ultrasound negative. Biopsy revealed IDC grade 1 ER 90%, PR 100%, Ki-67 5%, HER-2 negative ratio 1.19, T1 BN 0 stage I a clinical stage AJCC 8  07/25/2017:Right lumpectomy: IDC grade 2, 1 cm, margins negative, negative for lymphovascular or perineural invasion, 0/2 lymph nodes negative, ER 70%, PR 100%, HER-2 negative ratio 1.19, Ki-67 5%, T1 be N0 stage Ia Oncotype score 16: Distal recurrence risk at 9 years: 4% with antiestrogen therapy Adjuvant radiation therapy 08/30/2017 to 10/13/2017  Current treatment:Adjuvant antiestrogen therapywith tamoxifen 20 mg daily started 10/13/2017  Tamoxifen toxicities: Slight drowsiness: taking at bed time Dry mouth    Breast cancer surveillance:  08/07/2019 mammogram: Postsurgical changes, benign, breast density category B 08/13/2019: Breast exam: Benign  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  Today is her first day. Return to clinic in 1 year for follow-up    I discussed the assessment and treatment plan with the patient. The patient was provided an opportunity to ask questions and all were answered. The patient agreed with the plan and demonstrated an understanding of the instructions. The patient was advised to call back or seek an in-person evaluation if the symptoms worsen or if the condition fails to improve as anticipated.   I provided 20 minutes of face-to-face MyChart video visit time during this encounter.    Rulon Eisenmenger, MD 08/13/2019   I, Molly Dorshimer, am acting as scribe for [name], MD.  I have reviewed the  above documentation for accuracy and completeness, and I agree with the above.

## 2019-08-13 ENCOUNTER — Inpatient Hospital Stay: Payer: 59 | Attending: Hematology and Oncology | Admitting: Hematology and Oncology

## 2019-08-13 DIAGNOSIS — Z170 Estrogen receptor positive status [ER+]: Secondary | ICD-10-CM

## 2019-08-13 DIAGNOSIS — C50211 Malignant neoplasm of upper-inner quadrant of right female breast: Secondary | ICD-10-CM

## 2019-08-13 MED ORDER — TAMOXIFEN CITRATE 20 MG PO TABS: 20.0000 mg | ORAL_TABLET | Freq: Every day | ORAL | 3 refills | Status: DC

## 2019-08-13 NOTE — Assessment & Plan Note (Signed)
06/29/2017:Screening detected right breast mass at 2 o'clock position 0.7 cm, axillary ultrasound negative. Biopsy revealed IDC grade 1 ER 90%, PR 100%, Ki-67 5%, HER-2 negative ratio 1.19, T1 BN 0 stage I a clinical stage AJCC 8  07/25/2017:Right lumpectomy: IDC grade 2, 1 cm, margins negative, negative for lymphovascular or perineural invasion, 0/2 lymph nodes negative, ER 70%, PR 100%, HER-2 negative ratio 1.19, Ki-67 5%, T1 be N0 stage Ia Oncotype score 16: Distal recurrence risk at 9 years: 4% with antiestrogen therapy Adjuvant radiation therapy 08/30/2017 to 10/13/2017  Current treatment:Adjuvant antiestrogen therapywith tamoxifen 20 mg daily started 10/13/2017  Tamoxifen toxicities: Slight drowsiness: taking at bed time Dry mouth Occ Hot flashes  Breast cancer surveillance:  08/07/2018 mammogram: Postsurgical changes, benign, breast density category B 08/13/2019: Breast exam: Benign  Return to clinic in 1 year for follow-up

## 2019-11-13 IMAGING — US US EXTREM UP *R* LTD
1 series · 11 of 11 positions shown · non-contrast
Comparison: None

CLINICAL DATA: 46-year-old female with history of RIGHT breast
cancer status post RIGHT lumpectomy and sentinel lymph node excision
on 07/25/2017, currently undergoing radiation therapy. RIGHT
axillary seroma aspiration several weeks ago, now with fever and
RIGHT axillary redness..

EXAM:
ULTRASOUND OF THE RIGHT AXILLA

[Series 1: us extrem up *right* ltd · 0.19mm/px · 11 acquisitions, 11 frames shown]
[im 1/11]
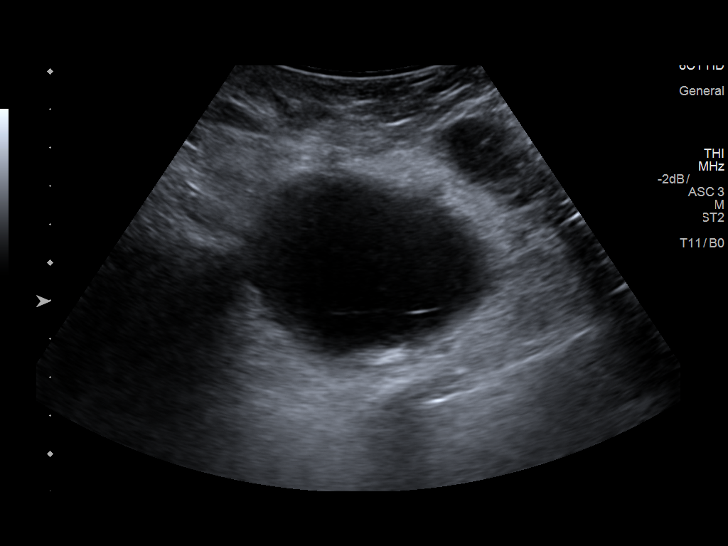
[im 2/11]
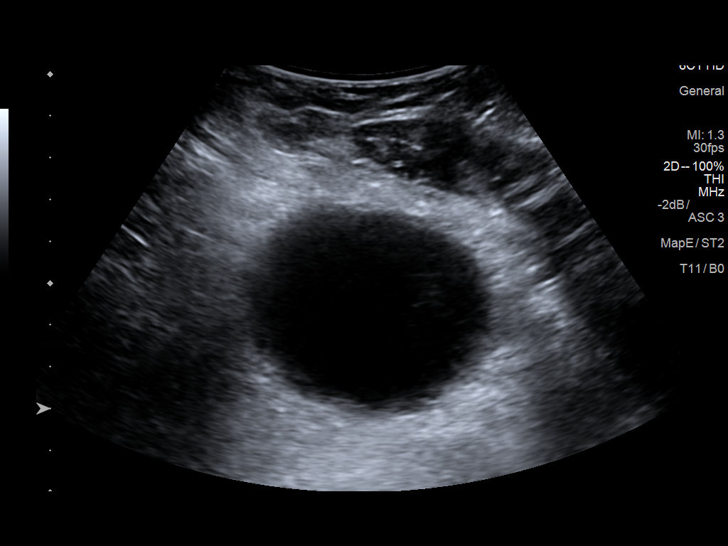
[im 3/11]
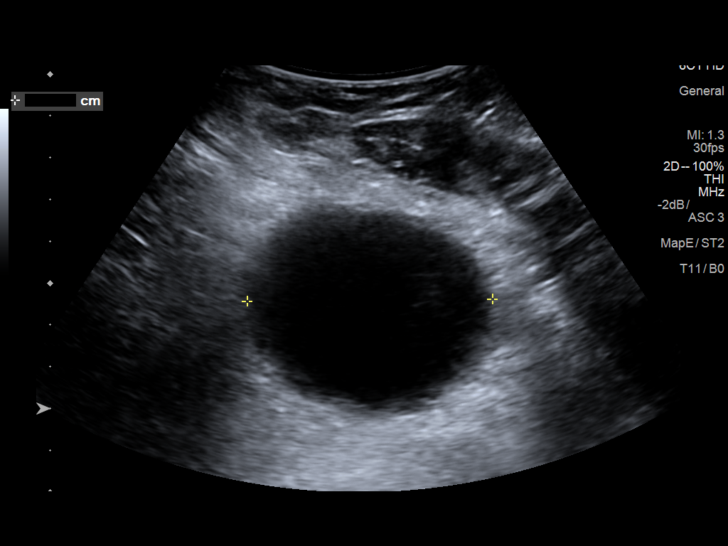
[im 4/11]
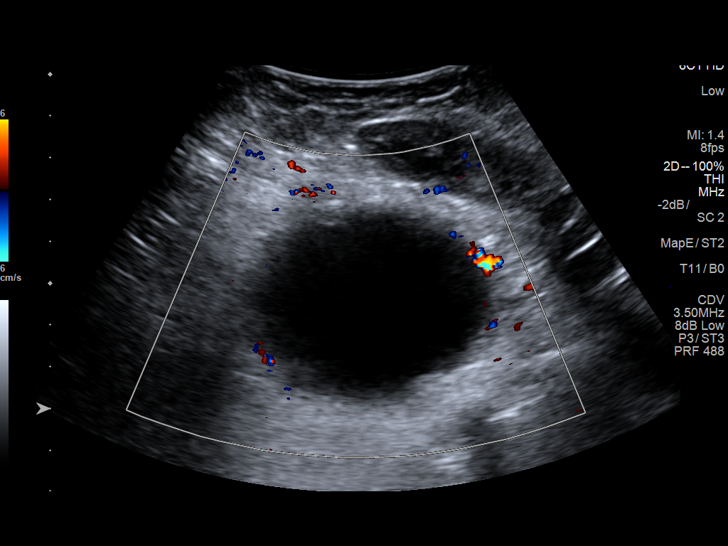
[im 5/11]
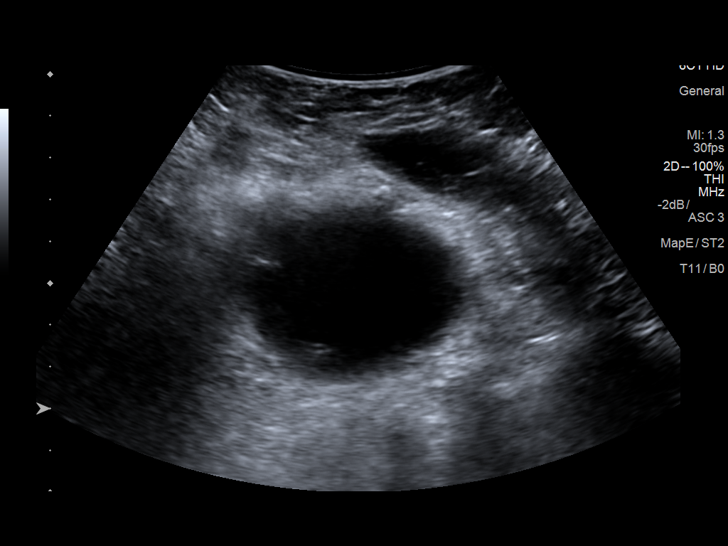
[im 6/11]
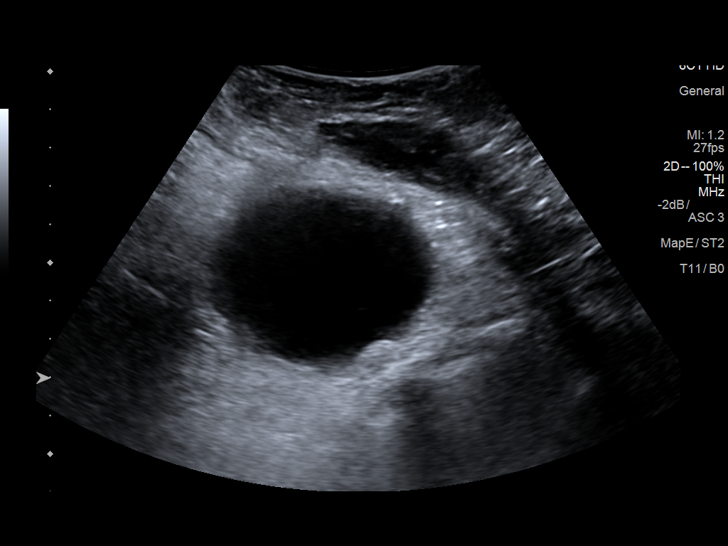
[im 7/11]
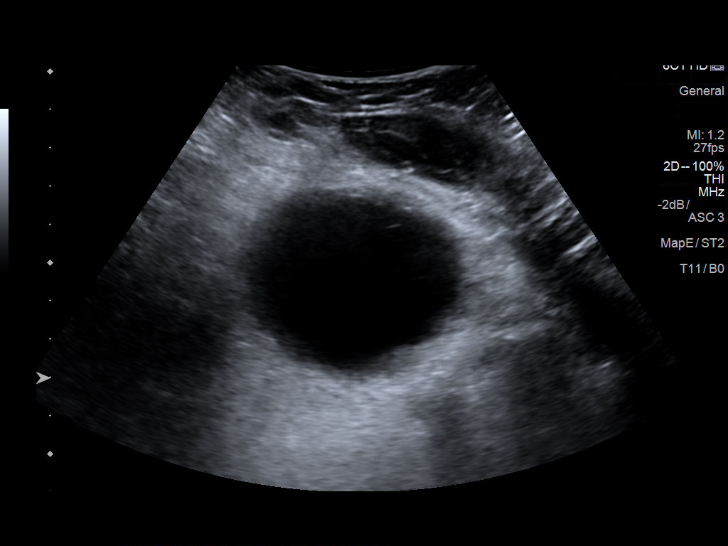
[im 8/11]
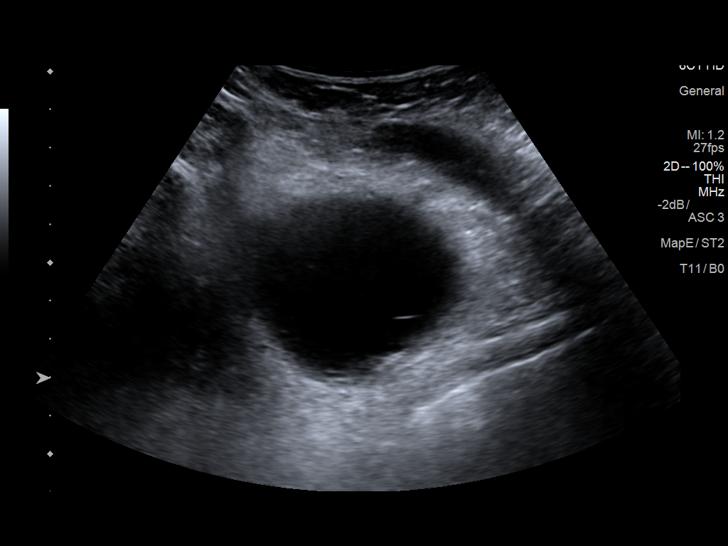
[im 9/11]
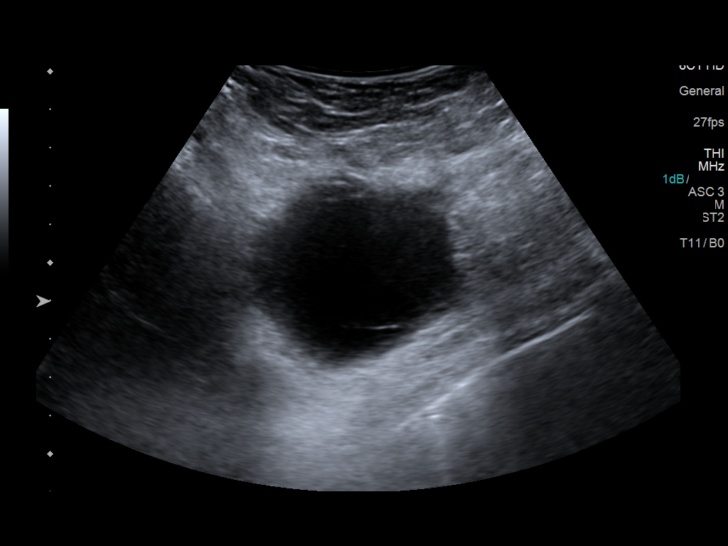
[im 10/11]
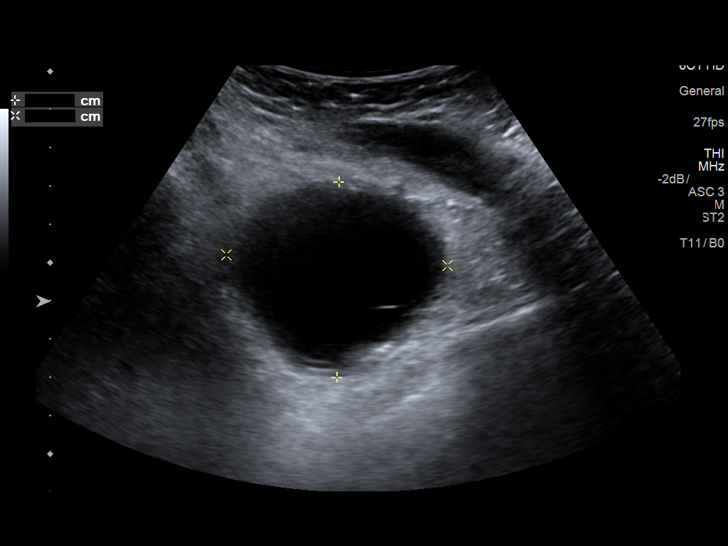
[im 11/11]
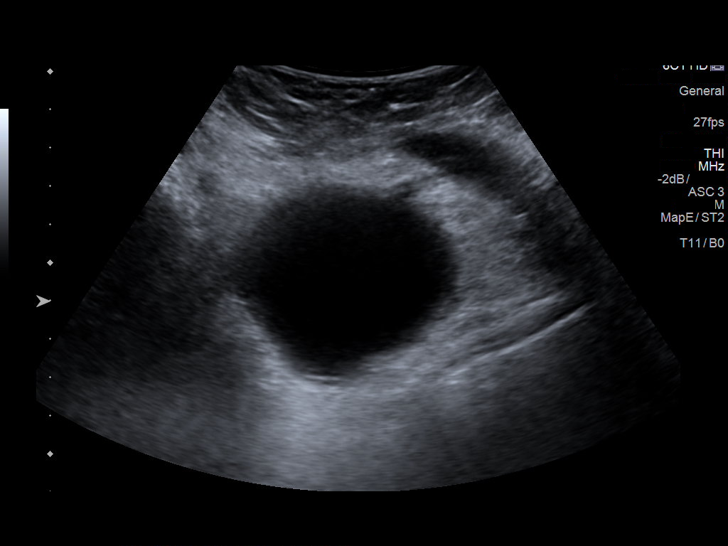

[11 of 11 positions shown; findings below may reference images not displayed]

FINDINGS: Ultrasound of the RIGHT axilla performed demonstrating a 5.1 x 5.8 x
5.8 cm fluid collection in the RIGHT axilla. No other abnormalities
in the RIGHT axilla identified.
IMPRESSION: 5.8 cm RIGHT axillary fluid collection - unable to determine
sonographically if this is infected/abscess.

RECOMMENDATION:
Clinical follow-up.

## 2019-11-13 IMAGING — US US GUIDANCE NEEDLE PLACEMENT
1 series · 4 of 4 positions shown · non-contrast
Comparison: none

INDICATION: 46-year-old with breast cancer and a postoperative seroma in the
right breast/axillary region. Request for ultrasound-guided
aspiration.

[Series 1: us guidance needle placement · 0.13mm/px · 4 of 4 slices shown]
[im 1/4]
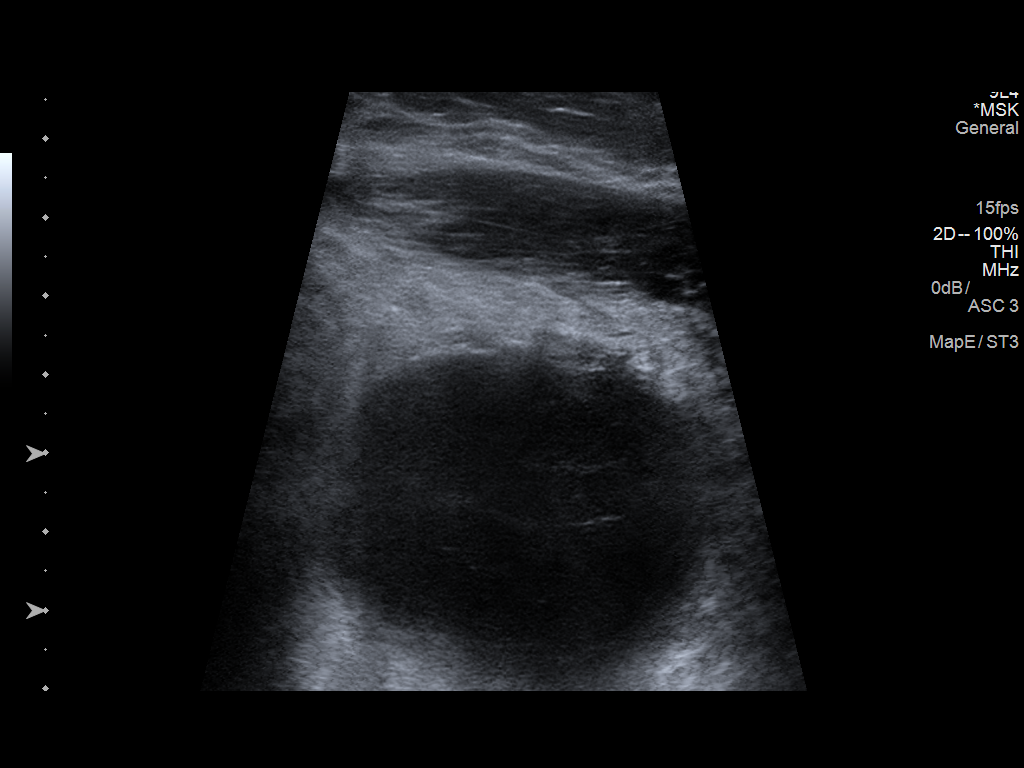
[im 2/4]
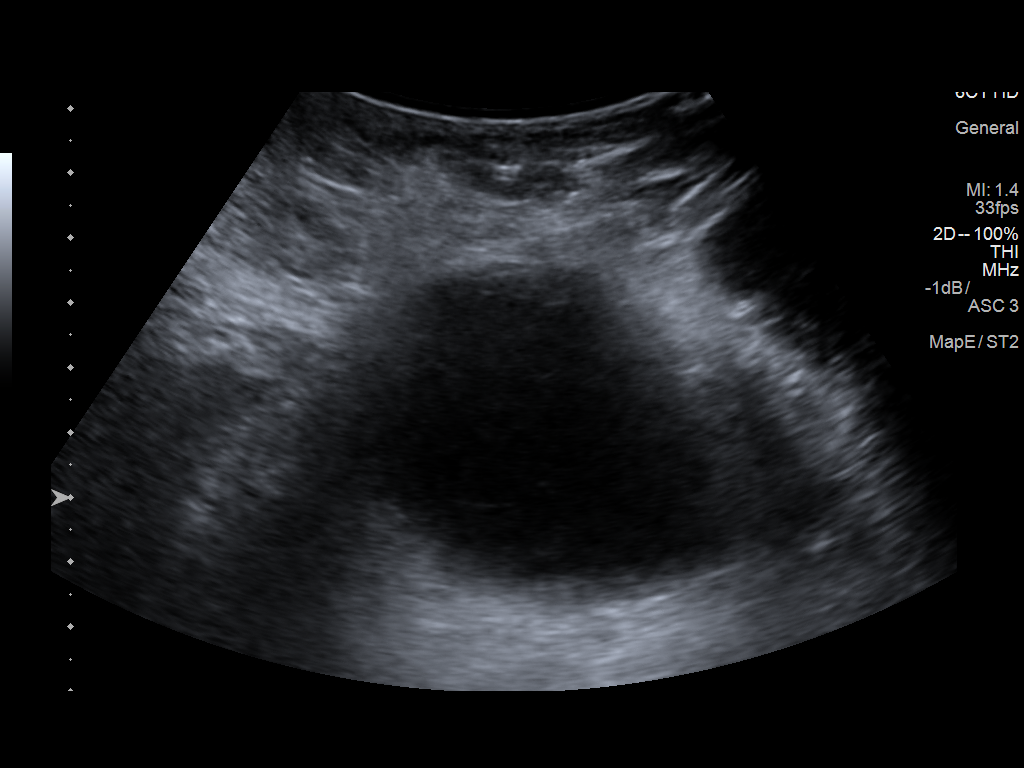
[im 3/4]
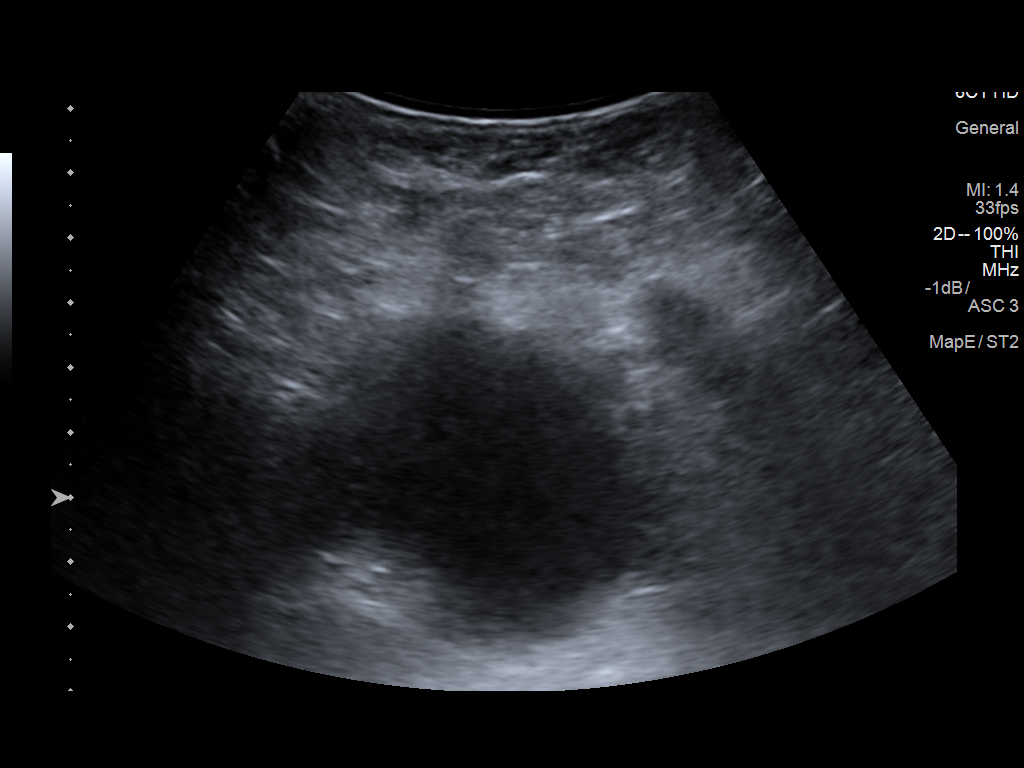
[im 4/4]
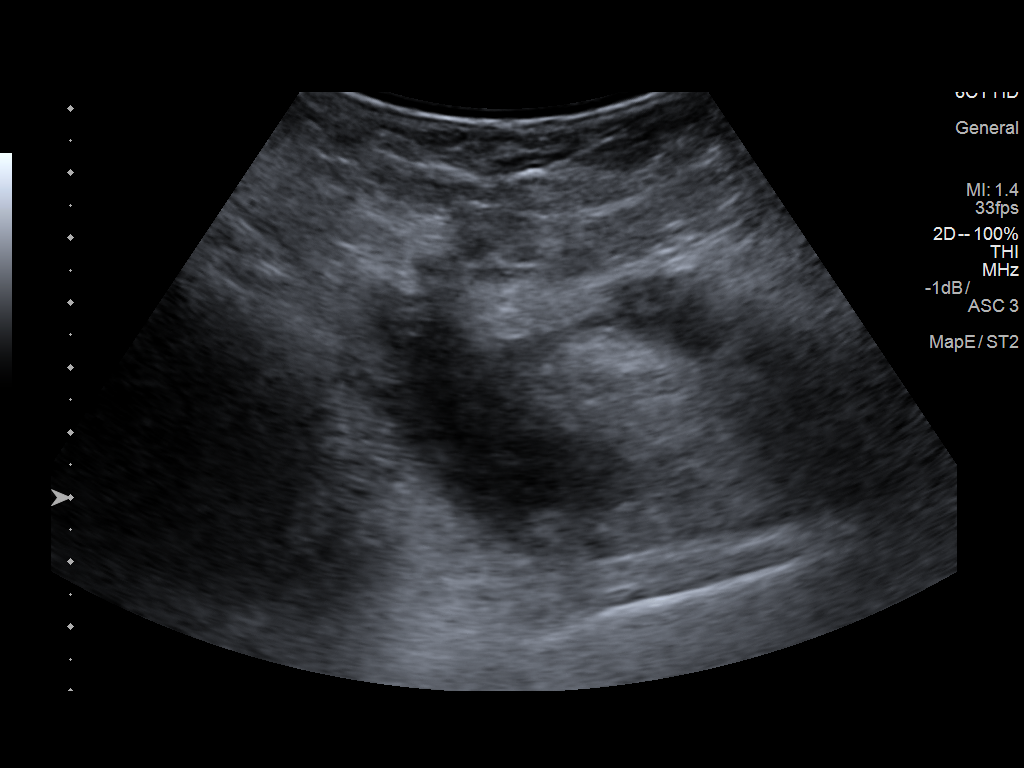

[4 of 4 positions shown; findings below may reference images not displayed]

EXAM:
ULTRASOUND-GUIDED ASPIRATION OF RIGHT AXILLARY SEROMA

MEDICATIONS:
None

ANESTHESIA/SEDATION:
None

COMPLICATIONS:
None immediate.

PROCEDURE:
Informed written consent was obtained from the patient after a
thorough discussion of the procedural risks, benefits and
alternatives. All questions were addressed. A timeout was performed
prior to the initiation of the procedure.

The right axillary region was evaluated with ultrasound. A fluid
collection in this area was identified. This area was prepped with
chlorhexidine and a sterile field was created. Skin was anesthetized
with 1% lidocaine. 10 cm Yueh catheter was directed into the fluid
collection with ultrasound guidance. Total of 60 mL of yellow fluid
was removed. The fluid was mildly cloudy. The collection was
decompressed following the aspiration. Bandage placed over the
puncture site.
FINDINGS: Fluid collection in the right axillary region. 60 mL of yellow fluid
was removed. The collection was decompressed at the end of the
procedure with minimal residual fluid.
IMPRESSION: Successful ultrasound-guided aspiration of the right axillary
seroma. Fluid was sent for culture.

## 2019-11-23 IMAGING — US US SOFT TISSUE EXCLUDE HEAD/NECK
1 series · 4 of 4 positions shown · non-contrast
Comparison: 09/15/2017

CLINICAL DATA: Right axillary abnormality, history of recent
aspiration

EXAM:
ULTRASOUND OF HEAD/NECK SOFT TISSUES
TECHNIQUE: Ultrasound examination of the head and neck soft tissues was
performed in the area of clinical concern.

[Series 1: us soft tissue exclude head/neck · 0.07mm/px · 4 of 4 slices shown]
[im 1/4]
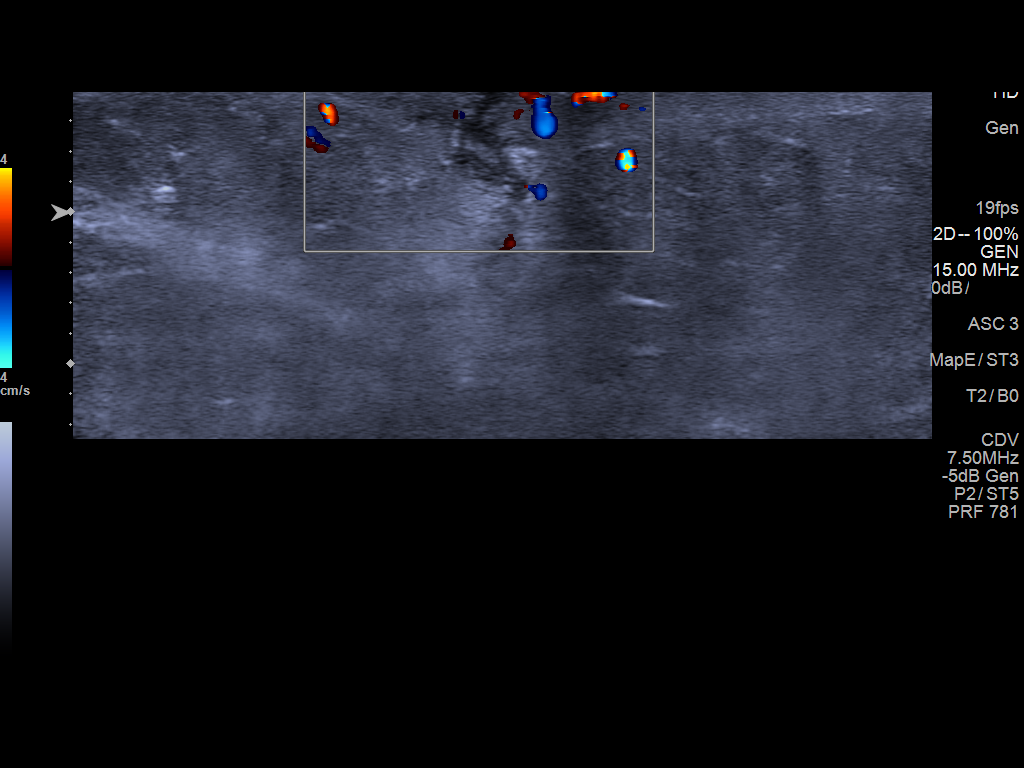
[im 2/4]
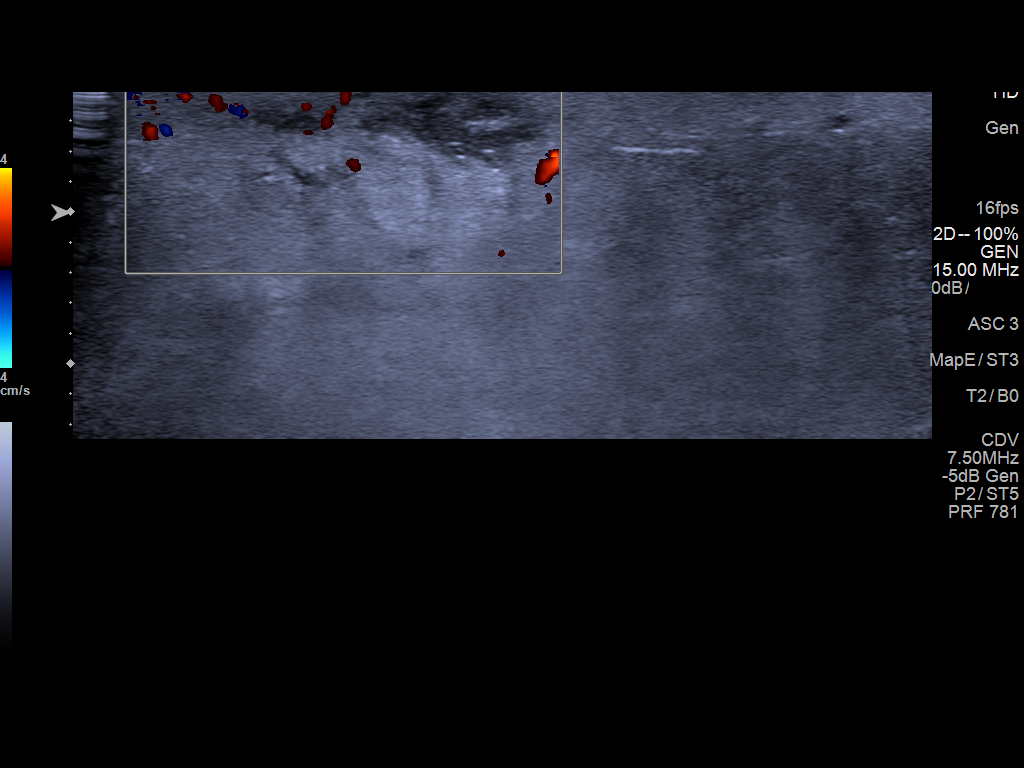
[im 3/4]
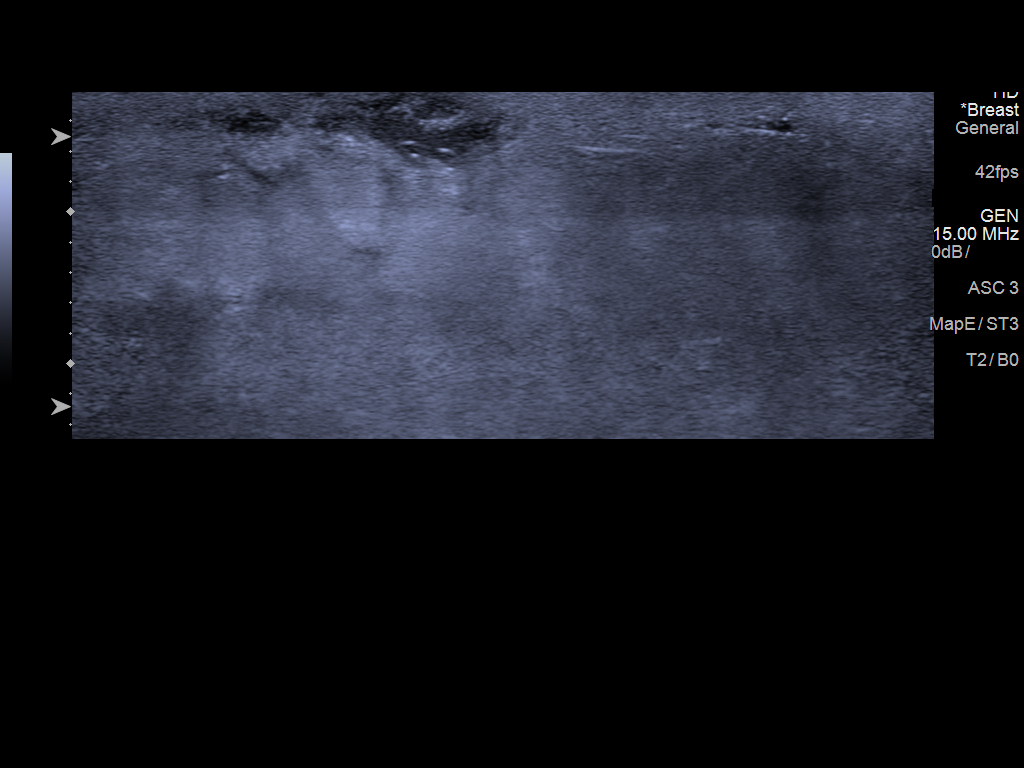
[im 4/4]
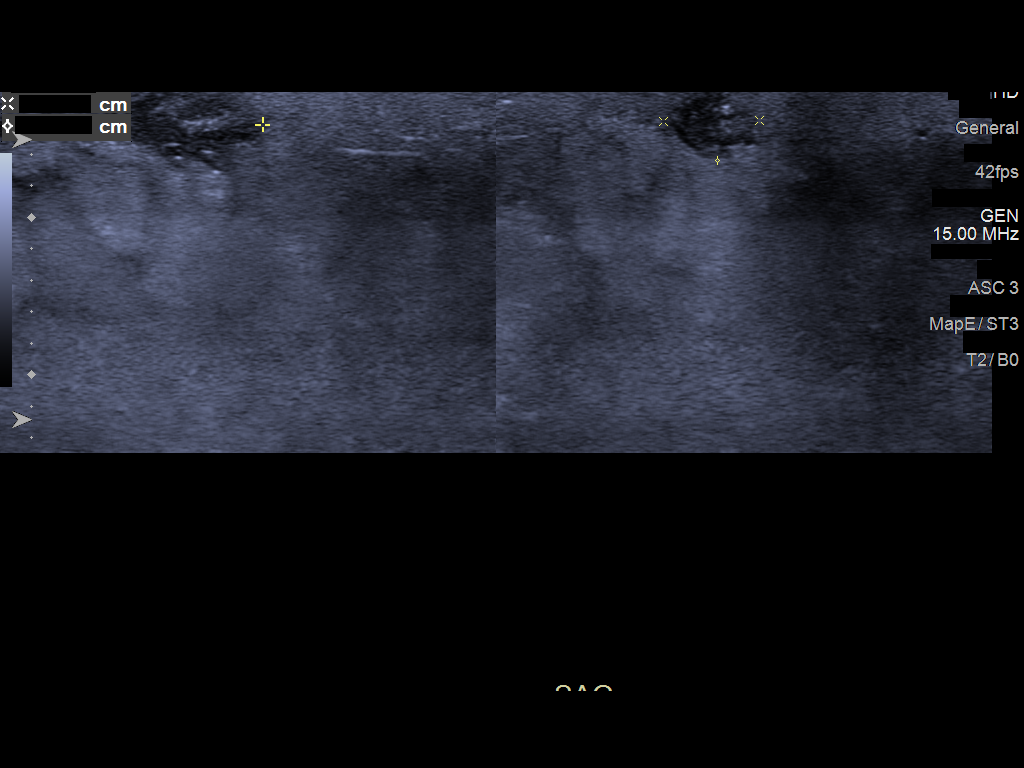

[4 of 4 positions shown; findings below may reference images not displayed]

FINDINGS: Sonographic evaluation of the right axilla shows no sizable
collection to suggest focal abscess. Some subcutaneous edema is
noted as well as skin changes which may be related to the recent
procedure.
IMPRESSION: No findings to suggest focal abscess or recurrent seroma are seen.

Subcutaneous changes are noted likely related to the recent drainage
procedure.

## 2020-05-28 ENCOUNTER — Other Ambulatory Visit: Payer: Self-pay | Admitting: Adult Health

## 2020-05-28 DIAGNOSIS — Z9889 Other specified postprocedural states: Secondary | ICD-10-CM

## 2020-08-04 ENCOUNTER — Other Ambulatory Visit: Payer: Self-pay | Admitting: Adult Health

## 2020-08-04 DIAGNOSIS — Z853 Personal history of malignant neoplasm of breast: Secondary | ICD-10-CM

## 2020-08-07 ENCOUNTER — Other Ambulatory Visit: Payer: Self-pay

## 2020-08-07 ENCOUNTER — Ambulatory Visit
Admission: RE | Admit: 2020-08-07 | Discharge: 2020-08-07 | Disposition: A | Discharge status: Home / Self Care | Payer: 59 | Source: Ambulatory Visit | Attending: Adult Health | Admitting: Adult Health

## 2020-08-07 DIAGNOSIS — Z853 Personal history of malignant neoplasm of breast: Secondary | ICD-10-CM

## 2020-10-07 NOTE — Assessment & Plan Note (Signed)
06/29/2017:Screening detected right breast mass at 2 o'clock position 0.7 cm, axillary ultrasound negative. Biopsy revealed IDC grade 1 ER 90%, PR 100%, Ki-67 5%, HER-2 negative ratio 1.19, T1 BN 0 stage I a clinical stage AJCC 8  07/25/2017:Right lumpectomy: IDC grade 2, 1 cm, margins negative, negative for lymphovascular or perineural invasion, 0/2 lymph nodes negative, ER 70%, PR 100%, HER-2 negative ratio 1.19, Ki-67 5%, T1 be N0 stage Ia Oncotype score 16: Distal recurrence risk at 9 years: 4% with antiestrogen therapy Adjuvant radiation therapy 08/30/2017 to 10/13/2017  Current treatment:Adjuvant antiestrogen therapywith tamoxifen 20 mg dailystarted 10/13/2017  Tamoxifen toxicities: Slight drowsiness: taking at bed time Dry mouth   Breast cancer surveillance:  08/07/2020 mammogram: Postsurgical changes, benign, breast density category B 10/07/2020: Breast exam: Benign  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  Return to clinic in 1 year for follow-up

## 2020-10-07 NOTE — Progress Notes (Signed)
HEMATOLOGY-ONCOLOGY [facility] VIDEO VISIT PROGRESS NOTE  I connected with [name] on 10/08/2020 at  8:30 AM EDT by MyChart video conference and verified that I am speaking with the correct person using two identifiers.  I discussed the limitations, risks, security and privacy concerns of performing an evaluation and management service by MyChart and the availability of in person appointments.  I also discussed with the patient that there may be a patient responsible charge related to this service. The patient expressed understanding and agreed to proceed.  Patient's Location: Home Physician Location: Clinic  CHIEF COMPLIANT: Follow-up of right breast cancer on tamoxifen  INTERVAL HISTORY: Margarit Minshall is a 50 y.o. female with above-mentioned history of right breast cancer treated with lumpectomy and radiation, who is currently on tamoxifen. Mammogram on 08/07/20 showed no evidence of malignancy bilaterally. She presents over MyChart today for annual follow-up.  Oncology History  Malignant neoplasm of upper-inner quadrant of right breast in female, estrogen receptor positive ([place])  06/29/2017 Initial Diagnosis   Screening detected right breast mass at 2 o'clock position 0.7 cm, axillary ultrasound negative.  Biopsy revealed IDC grade 1 ER 90%, PR 100%, Ki-67 5%, HER-2 negative ratio 1.19, T1 BN 0 stage I a clinical stage AJCC 8   07/13/2017 Genetic Testing   Negative genetic testing on the 9 gene STAT panel.  The STAT Breast cancer panel offered by [facility] includes sequencing and rearrangement analysis for the following 9 genes:  ATM, BRCA1, BRCA2, CDH1, CHEK2, PALB2, PTEN, STK11 and TP53.   The report date is Jul 13, 2017.  The Multi-Gene Panel offered by [facility] includes sequencing and/or deletion duplication testing of the following 83 genes: ALK, APC, ATM, AXIN2,BAP1,  BARD1, BLM, BMPR1A, BRCA1, BRCA2, BRIP1, CASR, CDC73, CDH1, CDK4, CDKN1B, CDKN1C, CDKN2A (p14ARF), CDKN2A (p16INK4a),  CEBPA, CHEK2, CTNNA1, DICER1, DIS3L2, EGFR (c.2369C>T, p.Thr790Met variant only), EPCAM (Deletion/duplication testing only), FH, FLCN, GATA2, GPC3, GREM1 (Promoter region deletion/duplication testing only), HOXB13 (c.251G>A, p.Gly84Glu), HRAS, KIT, MAX, MEN1, MET, MITF (c.952G>A, p.Glu318Lys variant only), MLH1, MSH2, MSH3, MSH6, MUTYH, NBN, NF1, NF2, NTHL1, PALB2, PDGFRA, PHOX2B, PMS2, POLD1, POLE, POT1, PRKAR1A, PTCH1, PTEN, RAD50, RAD51C, RAD51D, RB1, RECQL4, RET, RUNX1, SDHAF2, SDHA (sequence changes only), SDHB, SDHC, SDHD, SMAD4, SMARCA4, SMARCB1, SMARCE1, STK11, SUFU, TERT, TERT, TMEM127, TP53, TSC1, TSC2, VHL, WRN and WT1.   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  The report date is Jul 13, 2017.   07/25/2017 Surgery   Right lumpectomy: IDC grade 2, 1 cm, margins negative, negative for lymphovascular or perineural invasion, 0/2 lymph nodes negative, ER 70%, PR 100%, HER-2 negative ratio 1.19, Ki-67 5%, T1 be N0 stage Ia   08/12/2017 Oncotype testing   Oncotype score 16: Distal recurrence risk at 9 years: 4% with antiestrogen therapy   08/30/2017 - 10/13/2017 Radiation Therapy   Adjuvant radiation   10/2017 -  Anti-estrogen oral therapy   Tamoxifen daily     Observations/Objective:  There were no vitals filed for this visit. There is no height or weight on file to calculate BMI.  I have reviewed the data as listed CMP Latest Ref Rng & Units 09/16/2017 09/14/2017 07/06/2017  Glucose 70 - 99 mg/dL 90 163(H) 103  BUN 6 - 20 mg/dL 5(L) 6 10  Creatinine 0.44 - 1.00 mg/dL 0.76 0.81 0.90  Sodium 135 - 145 mmol/L 142 140 142  Potassium 3.5 - 5.1 mmol/L 3.9 3.5 4.4  Chloride 98 - 111 mmol/L 109 108 109  CO2 22 - 32 mmol/L 24 23 22  Calcium 8.9 - 10.3 mg/dL 8.4(L) 8.6(L) 8.9  Total Protein 6.5 - 8.1 g/dL - 6.6 6.8  Total Bilirubin 0.3 - 1.2 mg/dL - 0.8 0.4  Alkaline Phos 38 - 126 U/L - 86 81  AST 15 - 41 U/L - 24 25  ALT 0 - 44 U/L - 23 25    Lab Results  Component  Value Date   WBC 13.7 (H) 09/26/2017   HGB 11.4 (L) 09/26/2017   HCT 37.6 09/26/2017   MCV 88.3 09/26/2017   PLT 360 09/26/2017   NEUTROABS 9.7 (H) 09/24/2017      Assessment Plan:  Malignant neoplasm of upper-inner quadrant of right breast in female, estrogen receptor positive ([place]) 06/29/2017:Screening detected right breast mass at 2 o'clock position 0.7 cm, axillary ultrasound negative.  Biopsy revealed IDC grade 1 ER 90%, PR 100%, Ki-67 5%, HER-2 negative ratio 1.19, T1 BN 0 stage I a clinical stage AJCC 8   07/25/2017: Right lumpectomy: IDC grade 2, 1 cm, margins negative, negative for lymphovascular or perineural invasion, 0/2 lymph nodes negative, ER 70%, PR 100%, HER-2 negative ratio 1.19, Ki-67 5%, T1 be N0 stage Ia Oncotype score 16: Distal recurrence risk at 9 years: 4% with antiestrogen therapy Adjuvant radiation therapy 08/30/2017 to 10/13/2017   Current treatment: Adjuvant antiestrogen therapy with tamoxifen 20 mg daily started 10/13/2017   Tamoxifen toxicities: Slight drowsiness: taking at bed time   Breast cancer surveillance:  08/07/2020 mammogram: Postsurgical changes, benign, breast density category B   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  Return to clinic in 1 year for follow-up    I discussed the assessment and treatment plan with the patient. The patient was provided an opportunity to ask questions and all were answered. The patient agreed with the plan and demonstrated an understanding of the instructions. The patient was advised to call back or seek an in-person evaluation if the symptoms worsen or if the condition fails to improve as anticipated.   Total time spent: 20 minutes including face-to-face MyChart video visit time and time spent for planning, charting and coordination of care  Rulon Eisenmenger, MD 10/08/2020  I, Thana Ates am acting as scribe for [name], MD.  I have reviewed the above documentation for accuracy  and completeness, and I agree with the above.

## 2020-10-08 ENCOUNTER — Inpatient Hospital Stay: Payer: 59 | Attending: Hematology and Oncology | Admitting: Hematology and Oncology

## 2020-10-08 ENCOUNTER — Ambulatory Visit: Payer: 59 | Admitting: Hematology and Oncology

## 2020-10-08 DIAGNOSIS — Z170 Estrogen receptor positive status [ER+]: Secondary | ICD-10-CM

## 2020-10-08 DIAGNOSIS — C50211 Malignant neoplasm of upper-inner quadrant of right female breast: Secondary | ICD-10-CM

## 2020-10-08 MED ORDER — MELOXICAM 15 MG PO TABS: 15.0000 mg | ORAL_TABLET | Freq: Every day | ORAL | Status: DC

## 2020-10-08 MED ORDER — TAMOXIFEN CITRATE 20 MG PO TABS: 20.0000 mg | ORAL_TABLET | Freq: Every day | ORAL | 3 refills | Status: DC

## 2021-07-07 LAB — HM COLONOSCOPY

## 2021-07-09 ENCOUNTER — Other Ambulatory Visit: Payer: Self-pay | Admitting: Family Medicine

## 2021-07-09 DIAGNOSIS — Z1231 Encounter for screening mammogram for malignant neoplasm of breast: Secondary | ICD-10-CM

## 2021-07-16 LAB — HM PAP SMEAR

## 2021-08-26 ENCOUNTER — Ambulatory Visit
Admission: RE | Admit: 2021-08-26 | Discharge: 2021-08-26 | Disposition: A | Discharge status: Home / Self Care | Payer: 59 | Source: Ambulatory Visit | Attending: Family Medicine | Admitting: Family Medicine

## 2021-08-26 DIAGNOSIS — Z1231 Encounter for screening mammogram for malignant neoplasm of breast: Secondary | ICD-10-CM

## 2021-08-28 ENCOUNTER — Other Ambulatory Visit: Payer: Self-pay | Admitting: Family Medicine

## 2021-08-28 DIAGNOSIS — R928 Other abnormal and inconclusive findings on diagnostic imaging of breast: Secondary | ICD-10-CM

## 2021-09-04 ENCOUNTER — Ambulatory Visit: Payer: 59

## 2021-09-04 ENCOUNTER — Ambulatory Visit
Admission: RE | Admit: 2021-09-04 | Discharge: 2021-09-04 | Disposition: A | Discharge status: Home / Self Care | Payer: 59 | Source: Ambulatory Visit | Attending: Family Medicine | Admitting: Family Medicine

## 2021-09-04 DIAGNOSIS — R928 Other abnormal and inconclusive findings on diagnostic imaging of breast: Secondary | ICD-10-CM

## 2021-10-23 ENCOUNTER — Other Ambulatory Visit: Payer: Self-pay | Admitting: Hematology and Oncology

## 2021-10-26 ENCOUNTER — Telehealth: Payer: Self-pay | Admitting: Hematology and Oncology

## 2021-10-26 NOTE — Telephone Encounter (Signed)
Scheduled appointment per 9/8 staff message. Left voicemail.

## 2021-11-02 IMAGING — MG DIGITAL DIAGNOSTIC BILAT W/ TOMO W/ CAD
9 series · 9 of 25 positions shown · non-contrast
Comparison: Previous exam(s).

CLINICAL DATA: Right lumpectomy.  Annual mammography.

EXAM:
DIGITAL DIAGNOSTIC BILATERAL MAMMOGRAM WITH TOMOSYNTHESIS AND CAD
TECHNIQUE: Bilateral digital diagnostic mammography and breast tomosynthesis
was performed. The images were evaluated with computer-aided
detection.

[R MLO]
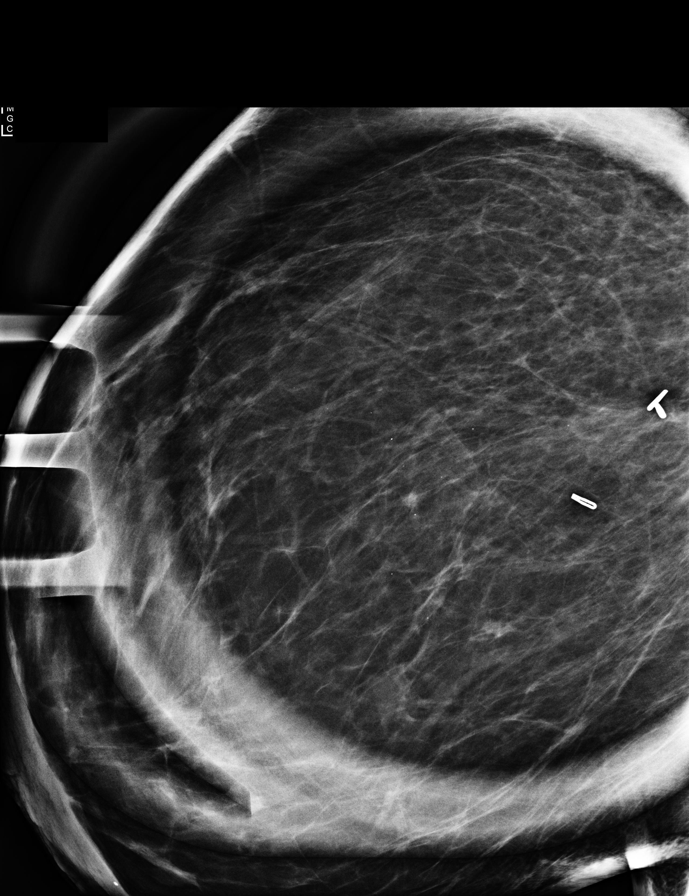

[R CC synth-2D]
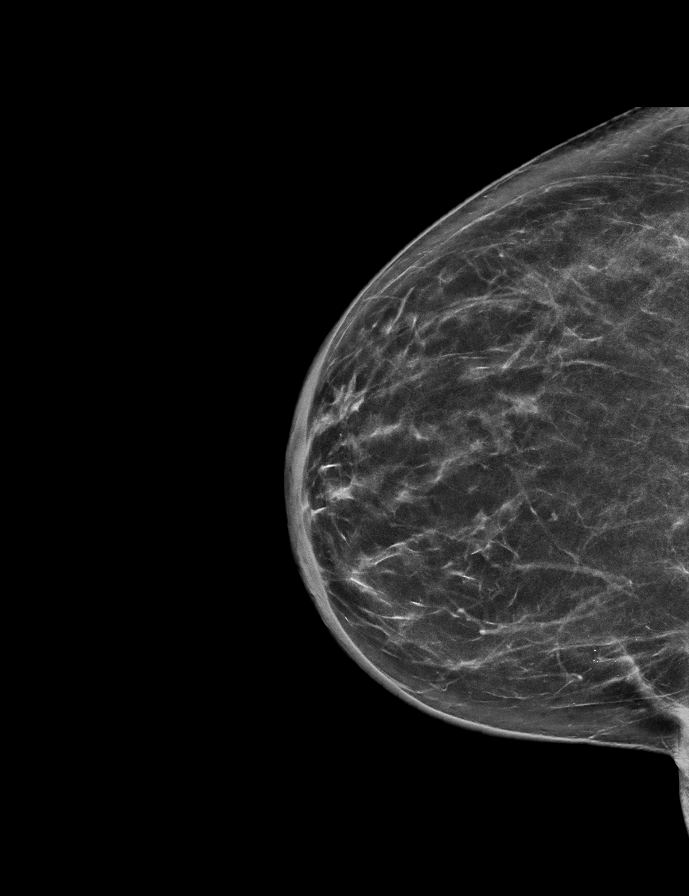

[R MLO synth-2D]
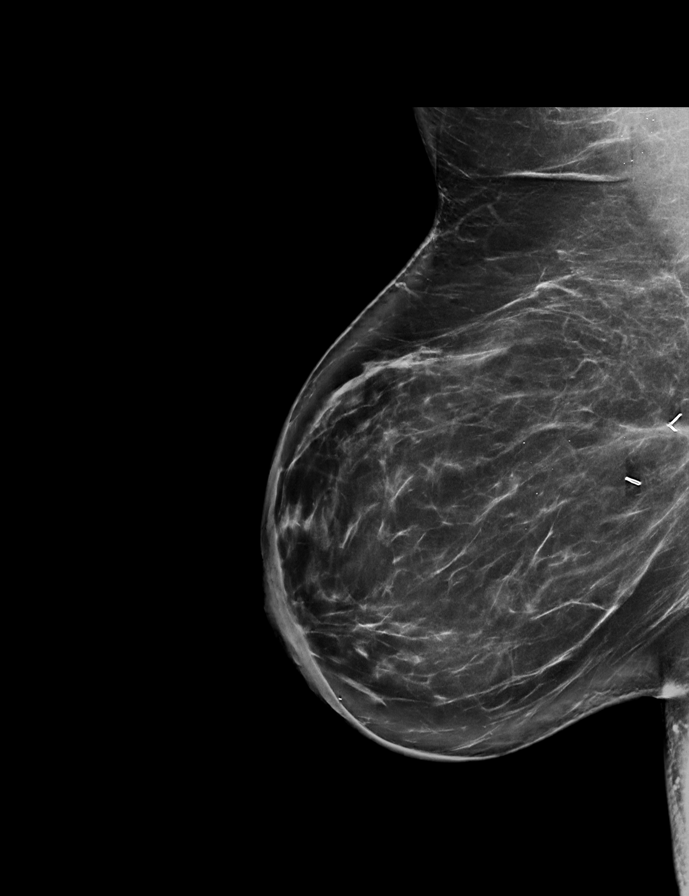

[L MLO synth-2D]
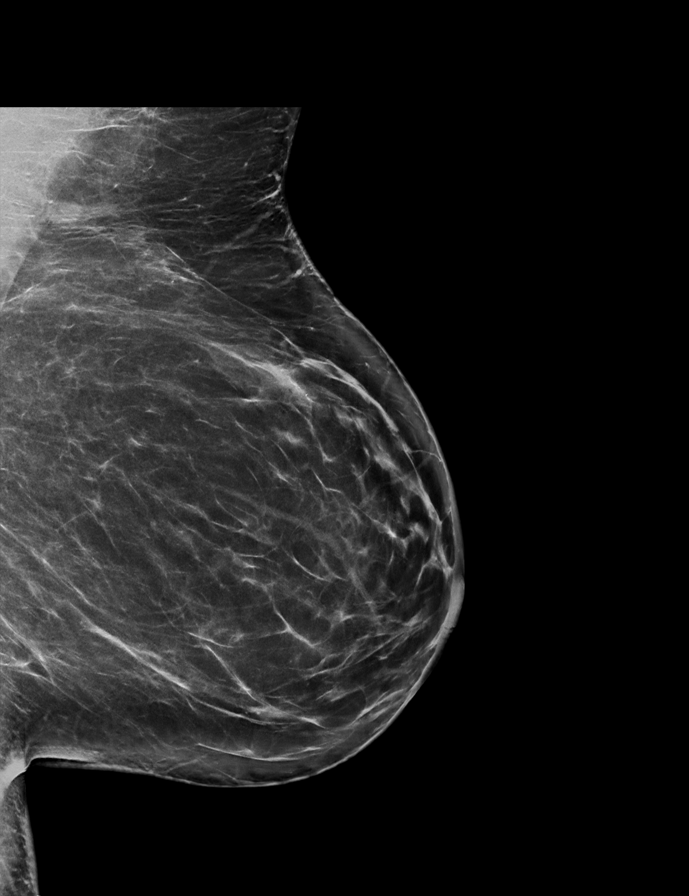

[L CC synth-2D]
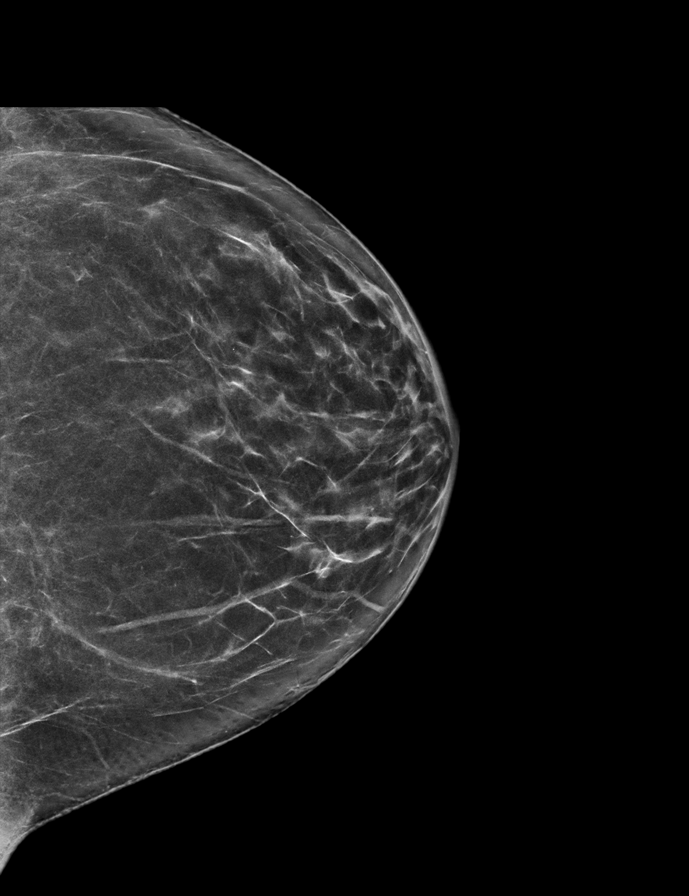

[L MLO tomo · tomo slice 42/83.0]
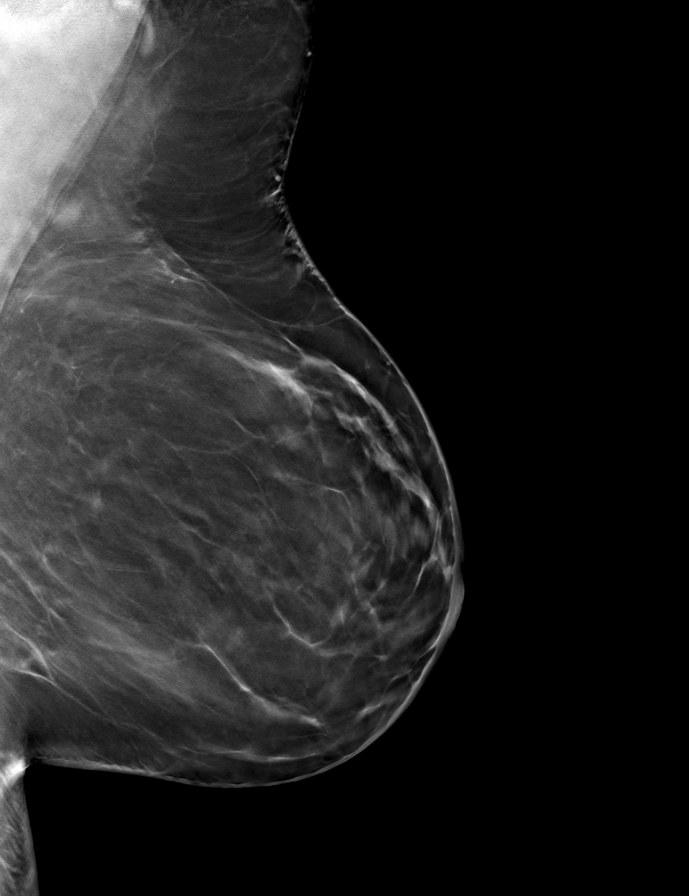

[L CC tomo · tomo slice 34/67.0]
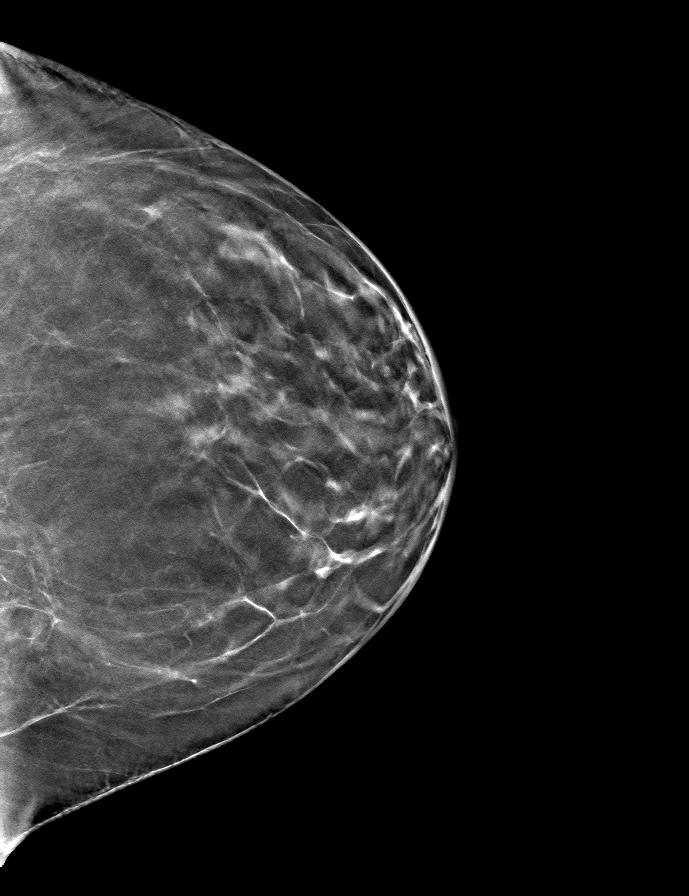

[R CC tomo · tomo slice 32/63.0]
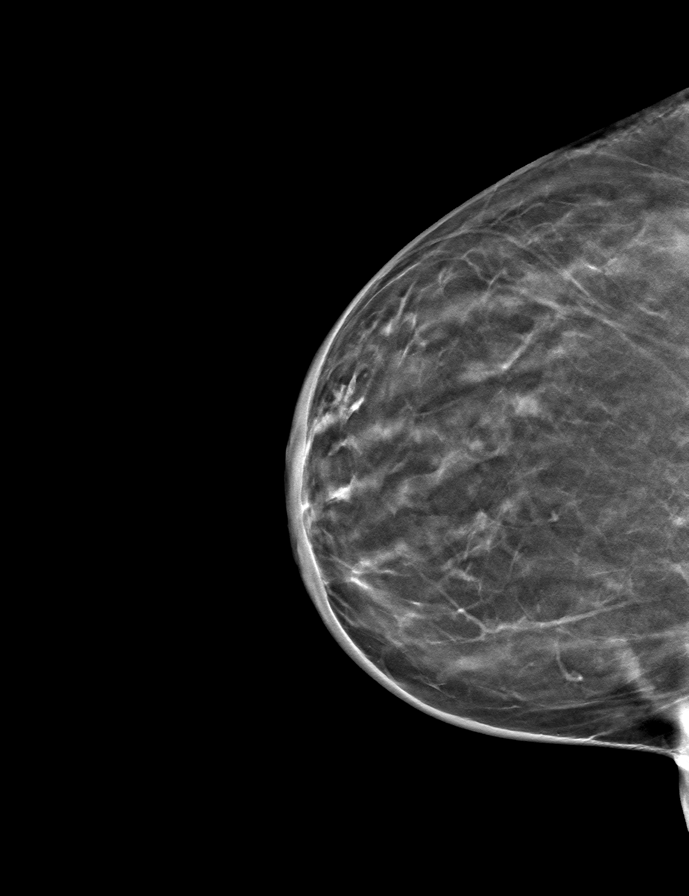

[R MLO tomo · tomo slice 41/82.0]
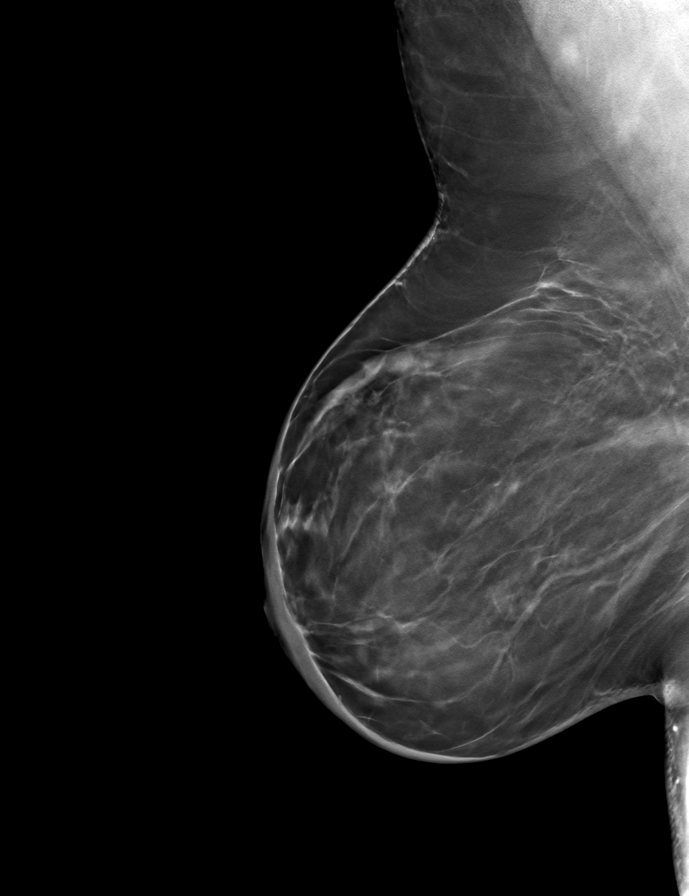

[9 of 25 positions shown; findings below may reference images not displayed]

ACR Breast Density Category b: There are scattered areas of
fibroglandular density.
FINDINGS: The right lumpectomy site is stable. No suspicious masses are
identified in either breast.
IMPRESSION: No mammographic evidence of malignancy.

RECOMMENDATION:
Per protocol, as the patient is now 2 or more years status post
lumpectomy, she may return to annual screening mammography in 1
year. However, given the history of breast cancer, the patient
remains eligible for annual diagnostic mammography if preferred.

I have discussed the findings and recommendations with the patient.
If applicable, a reminder letter will be sent to the patient
regarding the next appointment.

BI-RADS CATEGORY  2: Benign.

## 2021-11-22 NOTE — Progress Notes (Signed)
Patient Care Team: [name], Fanny Dance, MD as PCP - General (Family Medicine) Ovidio Kin, MD as Consulting Physician (General Surgery) Serena Croissant, MD as Consulting Physician (Hematology and Oncology) Lonie Peak, MD as Attending Physician (Radiation Oncology) Judyann Munson, MD as Consulting Physician (Infectious Diseases) Axel Filler Larna Daughters, NP as Nurse Practitioner (Hematology and Oncology)  DIAGNOSIS:  Encounter Diagnosis  Name Primary?   Malignant neoplasm of upper-inner quadrant of right breast in female, estrogen receptor positive ([facility])     SUMMARY OF ONCOLOGIC HISTORY: Oncology History  Malignant neoplasm of upper-inner quadrant of right breast in female, estrogen receptor positive ([facility])  06/29/2017 Initial Diagnosis   Screening detected right breast mass at 2 o'clock position 0.7 cm, axillary ultrasound negative.  Biopsy revealed IDC grade 1 ER 90%, PR 100%, Ki-67 5%, HER-2 negative ratio 1.19, T1 BN 0 stage I a clinical stage AJCC 8   07/13/2017 Genetic Testing   Negative genetic testing on the 9 gene STAT panel.  The STAT Breast cancer panel offered by [facility] includes sequencing and rearrangement analysis for the following 9 genes:  ATM, BRCA1, BRCA2, CDH1, CHEK2, PALB2, PTEN, STK11 and TP53.   The report date is Jul 13, 2017.  The Multi-Gene Panel offered by [facility] includes sequencing and/or deletion duplication testing of the following 83 genes: ALK, APC, ATM, AXIN2,BAP1,  BARD1, BLM, BMPR1A, BRCA1, BRCA2, BRIP1, CASR, CDC73, CDH1, CDK4, CDKN1B, CDKN1C, CDKN2A (p14ARF), CDKN2A (p16INK4a), CEBPA, CHEK2, CTNNA1, DICER1, DIS3L2, EGFR (c.2369C>T, p.Thr790Met variant only), EPCAM (Deletion/duplication testing only), FH, FLCN, GATA2, GPC3, GREM1 (Promoter region deletion/duplication testing only), HOXB13 (c.251G>A, p.Gly84Glu), HRAS, KIT, MAX, MEN1, MET, MITF (c.952G>A, p.Glu318Lys variant only), MLH1, MSH2, MSH3, MSH6, MUTYH, NBN, NF1, NF2, NTHL1, PALB2, PDGFRA, PHOX2B,  PMS2, POLD1, POLE, POT1, PRKAR1A, PTCH1, PTEN, RAD50, RAD51C, RAD51D, RB1, RECQL4, RET, RUNX1, SDHAF2, SDHA (sequence changes only), SDHB, SDHC, SDHD, SMAD4, SMARCA4, SMARCB1, SMARCE1, STK11, SUFU, TERT, TERT, TMEM127, TP53, TSC1, TSC2, VHL, WRN and WT1.   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  The report date is Jul 13, 2017.   07/25/2017 Surgery   Right lumpectomy: IDC grade 2, 1 cm, margins negative, negative for lymphovascular or perineural invasion, 0/2 lymph nodes negative, ER 70%, PR 100%, HER-2 negative ratio 1.19, Ki-67 5%, T1 be N0 stage Ia   08/12/2017 Oncotype testing   Oncotype score 16: Distal recurrence risk at 9 years: 4% with antiestrogen therapy   08/30/2017 - 10/13/2017 Radiation Therapy   Adjuvant radiation   10/2017 -  Anti-estrogen oral therapy   Tamoxifen daily     CHIEF COMPLIANT: Follow-up of right breast cancer on tamoxifen    INTERVAL HISTORY: Melissa [name] is a 51 y.o. female with above-mentioned history of right breast cancer treated with lumpectomy and radiation, who is currently on tamoxifen. She presents to the clinic for a follow-up. She reports that she still have dry mouth. But overall she is tolerating the tamoxifen. She states that hot flashes has got better. She does have some vaginal dryness. She has not used anything for relief. Denies any pain or discomfort in breast.   ALLERGIES:  has No Known Allergies.  MEDICATIONS:  Current Outpatient Medications  Medication Sig Dispense Refill   acetaminophen (TYLENOL) 500 MG tablet Take 1,000 mg by mouth every 6 (six) hours as needed for mild pain.      fexofenadine (ALLEGRA) 180 MG tablet Take 180 mg by mouth daily as needed for allergies or rhinitis.     ibuprofen (ADVIL,MOTRIN) 200 MG tablet Take 600 mg  by mouth every 6 (six) hours as needed for fever, headache, moderate pain or cramping.      meloxicam (MOBIC) 15 MG tablet Take 1 tablet (15 mg total) by mouth daily. (Patient  taking differently: Take 15 mg by mouth daily. Prn)     tamoxifen (NOLVADEX) 20 MG tablet Take 1 tablet (20 mg total) by mouth daily. 90 tablet 3   No current facility-administered medications for this visit.    PHYSICAL EXAMINATION: ECOG PERFORMANCE STATUS: 1 - Symptomatic but completely ambulatory  Vitals:   11/26/21 1107  BP: (!) 151/93  Pulse: 75  Resp: 18  Temp: (!) 97.2 F (36.2 C)  SpO2: 100%   Filed Weights   11/26/21 1107  Weight: 191 lb (86.6 kg)    BREAST: No palpable masses or nodules in either right or left breasts. No palpable axillary supraclavicular or infraclavicular adenopathy no breast tenderness or nipple discharge. (exam performed in the presence of a chaperone)  LABORATORY DATA:  I have reviewed the data as listed    Latest Ref Rng & Units 09/16/2017    4:56 AM 09/14/2017    9:06 PM 07/06/2017    8:20 AM  CMP  Glucose 70 - 99 mg/dL 90  163  103   BUN 6 - 20 mg/dL $Remove'5  6  10   'IPBNFTh$ Creatinine 0.44 - 1.00 mg/dL 0.76  0.81  0.90   Sodium 135 - 145 mmol/L 142  140  142   Potassium 3.5 - 5.1 mmol/L 3.9  3.5  4.4   Chloride 98 - 111 mmol/L 109  108  109   CO2 22 - 32 mmol/L $RemoveB'24  23  22   'zWJSiHnp$ Calcium 8.9 - 10.3 mg/dL 8.4  8.6  8.9   Total Protein 6.5 - 8.1 g/dL  6.6  6.8   Total Bilirubin 0.3 - 1.2 mg/dL  0.8  0.4   Alkaline Phos 38 - 126 U/L  86  81   AST 15 - 41 U/L  24  25   ALT 0 - 44 U/L  23  25     Lab Results  Component Value Date   WBC 13.7 (H) 09/26/2017   HGB 11.4 (L) 09/26/2017   HCT 37.6 09/26/2017   MCV 88.3 09/26/2017   PLT 360 09/26/2017   NEUTROABS 9.7 (H) 09/24/2017    ASSESSMENT & PLAN:  Malignant neoplasm of upper-inner quadrant of right breast in female, estrogen receptor positive ([place]) 06/29/2017:Screening detected right breast mass at 2 o'clock position 0.7 cm, axillary ultrasound negative.  Biopsy revealed IDC grade 1 ER 90%, PR 100%, Ki-67 5%, HER-2 negative ratio 1.19, T1 BN 0 stage I a clinical stage AJCC 8   07/25/2017: Right  lumpectomy: IDC grade 2, 1 cm, margins negative, negative for lymphovascular or perineural invasion, 0/2 lymph nodes negative, ER 70%, PR 100%, HER-2 negative ratio 1.19, Ki-67 5%, T1 be N0 stage Ia Oncotype score 16: Distal recurrence risk at 9 years: 4% with antiestrogen therapy Adjuvant radiation therapy 08/30/2017 to 10/13/2017   Current treatment: Adjuvant antiestrogen therapy with tamoxifen 20 mg daily started 10/13/2017   Tamoxifen toxicities: Slight drowsiness: taking at bed time   Breast cancer surveillance:  08/27/2021: Mammogram: Right breast asymmetry  09/04/2021: Diagnostic mammogram: No evidence of malignancy breast density category B 11/26/2021: Breast exam: Benign  She works with an Human resources officer   Return to clinic in 1 year for follow-up    No orders of the defined types were placed in this encounter.  The patient has a good understanding of the overall plan. she agrees with it. she will call with any problems that may develop before the next visit here. Total time spent: 30 mins including face to face time and time spent for planning, charting and co-ordination of care   Harriette Ohara, MD 11/26/21    I Gardiner Coins am scribing for Dr. [name]  I have reviewed the above documentation for accuracy and completeness, and I agree with the above.

## 2021-11-26 ENCOUNTER — Inpatient Hospital Stay: Payer: 59 | Attending: Hematology and Oncology | Admitting: Hematology and Oncology

## 2021-11-26 DIAGNOSIS — Z170 Estrogen receptor positive status [ER+]: Secondary | ICD-10-CM | POA: Insufficient documentation

## 2021-11-26 DIAGNOSIS — R682 Dry mouth, unspecified: Secondary | ICD-10-CM | POA: Insufficient documentation

## 2021-11-26 DIAGNOSIS — R232 Flushing: Secondary | ICD-10-CM | POA: Diagnosis not present

## 2021-11-26 DIAGNOSIS — Z79810 Long term (current) use of selective estrogen receptor modulators (SERMs): Secondary | ICD-10-CM | POA: Diagnosis not present

## 2021-11-26 DIAGNOSIS — Z923 Personal history of irradiation: Secondary | ICD-10-CM | POA: Diagnosis not present

## 2021-11-26 DIAGNOSIS — Z791 Long term (current) use of non-steroidal anti-inflammatories (NSAID): Secondary | ICD-10-CM | POA: Insufficient documentation

## 2021-11-26 DIAGNOSIS — C50211 Malignant neoplasm of upper-inner quadrant of right female breast: Secondary | ICD-10-CM | POA: Diagnosis present

## 2021-11-26 MED ORDER — TAMOXIFEN CITRATE 20 MG PO TABS: 20.0000 mg | ORAL_TABLET | Freq: Every day | ORAL | 3 refills | Status: DC

## 2021-11-26 NOTE — Assessment & Plan Note (Addendum)
06/29/2017:Screening detected right breast mass at 2 o'clock position 0.7 cm, axillary ultrasound negative. Biopsy revealed IDC grade 1 ER 90%, PR 100%, Ki-67 5%, HER-2 negative ratio 1.19, T1 BN 0 stage I a clinical stage AJCC 8  07/25/2017:Right lumpectomy: IDC grade 2, 1 cm, margins negative, negative for lymphovascular or perineural invasion, 0/2 lymph nodes negative, ER 70%, PR 100%, HER-2 negative ratio 1.19, Ki-67 5%, T1 be N0 stage Ia Oncotype score 16: Distal recurrence risk at 9 years: 4% with antiestrogen therapy Adjuvant radiation therapy 08/30/2017 to 10/13/2017  Current treatment:Adjuvant antiestrogen therapywith tamoxifen 20 mg dailystarted 10/13/2017  Tamoxifen toxicities: Slight drowsiness: taking at bed time  Breast cancer surveillance: 08/27/2021: Mammogram: Right breast asymmetry 09/04/2021: Diagnostic mammogram: No evidence of malignancy breast density category B 11/26/2021: Breast exam: Benign  She works with an Human resources officer   Return to clinic in 1 year for follow-up

## 2022-07-27 ENCOUNTER — Other Ambulatory Visit: Payer: Self-pay | Admitting: Family Medicine

## 2022-07-27 DIAGNOSIS — Z1231 Encounter for screening mammogram for malignant neoplasm of breast: Secondary | ICD-10-CM

## 2022-07-30 ENCOUNTER — Other Ambulatory Visit (HOSPITAL_COMMUNITY): Payer: Self-pay | Admitting: Family Medicine

## 2022-07-30 DIAGNOSIS — R55 Syncope and collapse: Secondary | ICD-10-CM

## 2022-08-05 ENCOUNTER — Ambulatory Visit: Payer: 59 | Attending: Cardiovascular Disease | Admitting: Cardiovascular Disease

## 2022-08-05 ENCOUNTER — Encounter: Payer: Self-pay | Admitting: Cardiovascular Disease

## 2022-08-05 ENCOUNTER — Other Ambulatory Visit: Payer: Self-pay

## 2022-08-05 VITALS — BP 106/72 | HR 83 | Ht 63.0 in | Wt 190.4 lb

## 2022-08-05 DIAGNOSIS — R55 Syncope and collapse: Secondary | ICD-10-CM | POA: Diagnosis not present

## 2022-08-05 NOTE — Patient Instructions (Signed)
Medication Instructions:  Your physician recommends that you continue on your current medications as directed. Please refer to the Current Medication list given to you today.   *If you need a refill on your cardiac medications before your next appointment, please call your pharmacy*   Follow-Up: At [facility], you and your health needs are our priority.  As part of our continuing mission to provide you with exceptional heart care, we have created designated Provider Care Teams.  These Care Teams include your primary Cardiologist (physician) and Advanced Practice Providers (APPs -  Physician Assistants and Nurse Practitioners) who all work together to provide you with the care you need, when you need it.   Your next appointment:   As needed  Provider:   Excell Seltzer

## 2022-08-05 NOTE — Progress Notes (Signed)
Cardiology Office Note:    Date:  08/05/2022   ID:  Melissa [name], DOB 05-26-1970, MRN [id number]  PCP:  Clayborn Heron, MD   [facility] Providers Cardiologist:  None     Referring MD: Clayborn Heron, MD   Chief Complaint  Patient presents with   Loss of Consciousness    History of Present Illness:    Melissa [name] is a 52 y.o. female here for evaluation of syncope. She has a hx of occasional leg cramps and woke up in the middle of night with a leg cramp. She got up and went to the bathroom and the leg cramp resolved. She then stood up and felt hot and sweaty. She then felt dizzy and doesn't remember anything else. Her husband found her on the floor unconscious and she quickly woke up after he woke her up. He checked her pulse and it was in normal range. She stayed awake for about 45 minutes after the episode and then ultimately felt better and went back to sleep without further problems since then. She also reports that she is having menopausal symptoms with hot flashes and other symptoms. 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 Her mother died at 5 years old from a 'massive MI.' The patient was diagnosed with breast cancer in 2019. She has been on Tamoxifen since that time. She was treated with lumpectomy and radiation initially and has done well at follow-up.  When she plays tennis, she occasionally has a feeling of a "rush" that makes her feel funny and lightheaded.  This resolves quickly and she does not need to modify her game or take a break.  She has not had other episodes of syncope.  She has not had exertional syncope.  She denies chest pain or pressure with physical activity.  She denies exertional dyspnea.  Past Medical History:  Diagnosis Date   Cancer (HCC) 06/2017   right breast cancer   Family history of prostate cancer    History of radiation therapy  08/29/17- 09/13/17 and 10/04/17- 10/13/17   right breast/ 32.04 Gy in 12 fractions, right breast boost/ 10.68 Gy in 4 fractions followed by 10 gy in 4 fractions. Break in treatment due to cellulitis and abscess to her biopsy site.    Personal history of radiation therapy     Past Surgical History:  Procedure Laterality Date   BREAST BIOPSY Right 06/2017   BREAST LUMPECTOMY Right 07/25/2017   BREAST LUMPECTOMY WITH RADIOACTIVE SEED AND SENTINEL LYMPH NODE BIOPSY Right 07/25/2017   Procedure: RIGHT BREAST LUMPECTOMY WITH RADIOACTIVE SEED AND SENTINEL LYMPH NODE BIOPSY;  Surgeon: Ovidio Kin, MD;  Location: [facility];  Service: General;  Laterality: Right;   CESAREAN SECTION  2006   LAPAROSCOPIC CHOLECYSTECTOMY  2010    Current Medications: Current Meds  Medication Sig   acetaminophen (TYLENOL) 500 MG tablet Take 1,000 mg by mouth every 6 (six) hours as needed for mild pain.    fexofenadine (ALLEGRA) 180 MG tablet Take 180 mg by mouth daily as needed for allergies or rhinitis.   ibuprofen (ADVIL,MOTRIN) 200 MG tablet Take 600 mg by mouth every 6 (six) hours as needed for fever, headache, moderate pain or cramping.    meloxicam (MOBIC) 15 MG tablet Take 1 tablet (15 mg total) by mouth daily. (Patient taking differently: Take 15 mg by mouth daily. Prn)   tamoxifen (NOLVADEX)  20 MG tablet Take 1 tablet (20 mg total) by mouth daily.     Allergies:   Patient has no known allergies.   Social History   Socioeconomic History   Marital status: Married    Spouse name: Onalee Hua   Number of children: 1   Years of education: 16   Highest education level: Not on file  Occupational History   Not on file  Tobacco Use   Smoking status: Never   Smokeless tobacco: Never  Vaping Use   Vaping Use: Never used  Substance and Sexual Activity   Alcohol use: Yes    Alcohol/week: 1.0 standard drink of alcohol    Types: 1 Glasses of wine per week   Drug use: Never   Sexual activity: Yes     Birth control/protection: Condom    Comment: stopped BCP 07-04-17  Other Topics Concern   Not on file  Social History Narrative   Not on file   Social Determinants of Health   Financial Resource Strain: Low Risk  (09/22/2017)   Overall Financial Resource Strain (CARDIA)    Difficulty of Paying Living Expenses: Not hard at all  Food Insecurity: No Food Insecurity (09/22/2017)   Hunger Vital Sign    Worried About Running Out of Food in the Last Year: Never true    Ran Out of Food in the Last Year: Never true  Transportation Needs: No Transportation Needs (09/22/2017)   PRAPARE - Administrator, Civil Service (Medical): No    Lack of Transportation (Non-Medical): No  Physical Activity: Unknown (09/22/2017)   Exercise Vital Sign    Days of Exercise per Week: 3 days    Minutes of Exercise per Session: Not on file  Stress: No Stress Concern Present (09/22/2017)   [employer] of Occupational Health - Occupational Stress Questionnaire    Feeling of Stress : Only a little  Social Connections: Not on file     Family History: The patient's family history includes Arthritis in her paternal grandmother; Cancer in her brother; Diabetes in her father, mother, and paternal grandfather; Heart attack (age of onset: 47) in her mother; Hypertension in her father and mother; Kidney failure in her father and paternal grandfather; Lung cancer in her paternal uncle; Osteoporosis in her paternal grandmother; Prostate cancer in her father.  ROS:   Please see the history of present illness.    All other systems reviewed and are negative.  EKGs/Labs/Other Studies Reviewed:         EKG Interpretation  Date/Time:  Thursday August 05 2022 08:53:53 EDT Ventricular Rate:  78 PR Interval:  154 QRS Duration: 86 QT Interval:  384 QTC Calculation: 437 R Axis:   -24 Text Interpretation: Normal sinus rhythm Low voltage QRS Cannot rule out Anterior infarct , age undetermined No previous ECGs available  Confirmed by Tonny Bollman [phone]) on 08/05/2022 9:23:10 AM    Recent Labs: No results found for requested labs within last 365 days.  Recent Lipid Panel No results found for: "CHOL", "TRIG", "HDL", "CHOLHDL", "VLDL", "LDLCALC", "LDLDIRECT"   Risk Assessment/Calculations:                Physical Exam:    VS:  BP 106/72   Pulse 83   Ht 5\' 3"  (1.6 m)   Wt 190 lb 6.4 oz (86.4 kg)   SpO2 99%   BMI 33.73 kg/m     Wt Readings from Last 3 Encounters:  08/05/22 190 lb 6.4 oz (86.4 kg)  11/26/21 191 lb (86.6 kg)  01/19/18 183 lb 4.8 oz (83.1 kg)     GEN:  Well nourished, well developed in no acute distress HEENT: Normal NECK: No JVD; No carotid bruits LYMPHATICS: No lymphadenopathy CARDIAC: RRR, no murmurs, rubs, gallops RESPIRATORY:  Clear to auscultation without rales, wheezing or rhonchi  ABDOMEN: Soft, non-tender, non-distended MUSCULOSKELETAL:  No edema; No deformity  SKIN: Warm and dry NEUROLOGIC:  Alert and oriented x 3 PSYCHIATRIC:  Normal affect   ASSESSMENT:    1. Syncope and collapse    PLAN:    In order of problems listed above:  Very typical episode of vasovagal syncope precipitated by pain with a leg cramp, followed by feeling hot and diaphoretic, then lightheaded, then with frank syncope.  We discussed the importance of staying well-hydrated.  The patient's cardiovascular exam is normal.  Her EKG is normal.  I have recommended an echocardiogram to evaluate for any structural heart abnormality and this is already scheduled for next week.  We discussed lifestyle modification to avoid episodes of frank syncope.  She will notify me if any further problems.  I will follow-up on her echo results as soon as they are available.  I also reviewed recent labs that were done by her primary care physician and these were within normal limits.  There is no evidence of anemia, kidney injury, or other significant abnormality on the labs that are available to me.            Medication Adjustments/Labs and Tests Ordered: Current medicines are reviewed at length with the patient today.  Concerns regarding medicines are outlined above.  No orders of the defined types were placed in this encounter.  No orders of the defined types were placed in this encounter.   There are no Patient Instructions on file for this visit.   Signed, Tonny Bollman, MD  08/05/2022 9:26 AM    [facility]

## 2022-08-13 ENCOUNTER — Ambulatory Visit (HOSPITAL_COMMUNITY)
Admission: RE | Admit: 2022-08-13 | Discharge: 2022-08-13 | Disposition: A | Discharge status: Home / Self Care | Payer: 59 | Source: Ambulatory Visit | Attending: Family Medicine | Admitting: Family Medicine

## 2022-08-13 DIAGNOSIS — Z853 Personal history of malignant neoplasm of breast: Secondary | ICD-10-CM | POA: Insufficient documentation

## 2022-08-13 DIAGNOSIS — I2510 Atherosclerotic heart disease of native coronary artery without angina pectoris: Secondary | ICD-10-CM | POA: Diagnosis not present

## 2022-08-13 DIAGNOSIS — R55 Syncope and collapse: Secondary | ICD-10-CM | POA: Diagnosis present

## 2022-08-13 LAB — ECHOCARDIOGRAM COMPLETE
Area-P 1/2: 3.31 cm2
S' Lateral: 2.6 cm

## 2022-08-13 NOTE — Progress Notes (Signed)
  Echocardiogram 2D Echocardiogram has been performed.  Delcie Roch 08/13/2022, 8:58 AM

## 2022-09-15 ENCOUNTER — Ambulatory Visit: Payer: 59

## 2022-09-16 ENCOUNTER — Ambulatory Visit: Payer: 59

## 2022-09-17 ENCOUNTER — Ambulatory Visit
Admission: RE | Admit: 2022-09-17 | Discharge: 2022-09-17 | Disposition: A | Discharge status: Home / Self Care | Payer: 59 | Source: Ambulatory Visit | Attending: Family Medicine | Admitting: Family Medicine

## 2022-09-17 DIAGNOSIS — Z1231 Encounter for screening mammogram for malignant neoplasm of breast: Secondary | ICD-10-CM

## 2022-11-23 ENCOUNTER — Telehealth: Payer: Self-pay | Admitting: Hematology and Oncology

## 2022-11-23 NOTE — Telephone Encounter (Signed)
11/23/22; Due to a change in the provider schedule, I called patient and rescheduled appointment. The patient is aware of the new date and time.

## 2022-12-01 ENCOUNTER — Inpatient Hospital Stay: Payer: 59 | Admitting: Hematology and Oncology

## 2022-12-28 ENCOUNTER — Inpatient Hospital Stay: Payer: 59 | Attending: Hematology and Oncology | Admitting: Hematology and Oncology

## 2022-12-28 VITALS — BP 143/93 | HR 94 | Temp 97.5°F | Resp 18 | Ht 63.0 in | Wt 192.5 lb

## 2022-12-28 DIAGNOSIS — C50211 Malignant neoplasm of upper-inner quadrant of right female breast: Secondary | ICD-10-CM | POA: Diagnosis not present

## 2022-12-28 DIAGNOSIS — Z79810 Long term (current) use of selective estrogen receptor modulators (SERMs): Secondary | ICD-10-CM | POA: Insufficient documentation

## 2022-12-28 DIAGNOSIS — Z170 Estrogen receptor positive status [ER+]: Secondary | ICD-10-CM | POA: Diagnosis not present

## 2022-12-28 DIAGNOSIS — Z923 Personal history of irradiation: Secondary | ICD-10-CM | POA: Insufficient documentation

## 2022-12-28 NOTE — Progress Notes (Signed)
Patient Care Team: [name], Fanny Dance, MD as PCP - General (Family Medicine) Ovidio Kin, MD as Consulting Physician (General Surgery) Serena Croissant, MD as Consulting Physician (Hematology and Oncology) Lonie Peak, MD as Attending Physician (Radiation Oncology) Judyann Munson, MD as Consulting Physician (Infectious Diseases) Axel Filler Larna Daughters, NP as Nurse Practitioner (Hematology and Oncology)  DIAGNOSIS:  Encounter Diagnosis  Name Primary?   Malignant neoplasm of upper-inner quadrant of right breast in female, estrogen receptor positive (HCC) Yes    SUMMARY OF ONCOLOGIC HISTORY: Oncology History  Malignant neoplasm of upper-inner quadrant of right breast in female, estrogen receptor positive (HCC)  06/29/2017 Initial Diagnosis   Screening detected right breast mass at 2 o'clock position 0.7 cm, axillary ultrasound negative.  Biopsy revealed IDC grade 1 ER 90%, PR 100%, Ki-67 5%, HER-2 negative ratio 1.19, T1 BN 0 stage I a clinical stage AJCC 8   07/13/2017 Genetic Testing   Negative genetic testing on the 9 gene STAT panel.  The STAT Breast cancer panel offered by [facility] includes sequencing and rearrangement analysis for the following 9 genes:  ATM, BRCA1, BRCA2, CDH1, CHEK2, PALB2, PTEN, STK11 and TP53.   The report date is Jul 13, 2017.  The Multi-Gene Panel offered by [facility] includes sequencing and/or deletion duplication testing of the following 83 genes: ALK, APC, ATM, AXIN2,BAP1,  BARD1, BLM, BMPR1A, BRCA1, BRCA2, BRIP1, CASR, CDC73, CDH1, CDK4, CDKN1B, CDKN1C, CDKN2A (p14ARF), CDKN2A (p16INK4a), CEBPA, CHEK2, CTNNA1, DICER1, DIS3L2, EGFR (c.2369C>T, p.Thr790Met variant only), EPCAM (Deletion/duplication testing only), FH, FLCN, GATA2, GPC3, GREM1 (Promoter region deletion/duplication testing only), HOXB13 (c.251G>A, p.Gly84Glu), HRAS, KIT, MAX, MEN1, MET, MITF (c.952G>A, p.Glu318Lys variant only), MLH1, MSH2, MSH3, MSH6, MUTYH, NBN, NF1, NF2, NTHL1, PALB2, PDGFRA,  PHOX2B, PMS2, POLD1, POLE, POT1, PRKAR1A, PTCH1, PTEN, RAD50, RAD51C, RAD51D, RB1, RECQL4, RET, RUNX1, SDHAF2, SDHA (sequence changes only), SDHB, SDHC, SDHD, SMAD4, SMARCA4, SMARCB1, SMARCE1, STK11, SUFU, TERT, TERT, TMEM127, TP53, TSC1, TSC2, VHL, WRN and WT1.   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  The report date is Jul 13, 2017.   07/25/2017 Surgery   Right lumpectomy: IDC grade 2, 1 cm, margins negative, negative for lymphovascular or perineural invasion, 0/2 lymph nodes negative, ER 70%, PR 100%, HER-2 negative ratio 1.19, Ki-67 5%, T1 be N0 stage Ia   08/12/2017 Oncotype testing   Oncotype score 16: Distal recurrence risk at 9 years: 4% with antiestrogen therapy   08/30/2017 - 10/13/2017 Radiation Therapy   Adjuvant radiation   10/2017 -  Anti-estrogen oral therapy   Tamoxifen daily     CHIEF COMPLIANT: Follow-up to discuss tamoxifen therapy  HISTORY OF PRESENT ILLNESS:   History of Present Illness   The patient, a breast cancer survivor, presents with concerns about the side effects of Tamoxifen. She reports experiencing symptoms similar to menopause, including hot flashes, dry skin, vaginal dryness, and a decreased sex drive. These symptoms have been ongoing and have significantly impacted her quality of life. The patient expresses a desire to discontinue Tamoxifen due to these side effects.  In addition to the side effects of Tamoxifen, the patient also discusses her struggle with weight gain and decreased energy levels. She attributes these changes to a combination of the medication and the natural aging process.  The patient also mentions that she is nearing the end of her menstrual cycles, with periods becoming increasingly infrequent. She expresses uncertainty about whether discontinuing Tamoxifen will affect this process.         ALLERGIES:  has No Known Allergies.  MEDICATIONS:  Current Outpatient Medications  Medication Sig Dispense Refill    acetaminophen (TYLENOL) 500 MG tablet Take 1,000 mg by mouth every 6 (six) hours as needed for mild pain.      fexofenadine (ALLEGRA) 180 MG tablet Take 180 mg by mouth daily as needed for allergies or rhinitis.     ibuprofen (ADVIL,MOTRIN) 200 MG tablet Take 600 mg by mouth every 6 (six) hours as needed for fever, headache, moderate pain or cramping.      meloxicam (MOBIC) 15 MG tablet Take 1 tablet (15 mg total) by mouth daily. (Patient taking differently: Take 15 mg by mouth daily. Prn)     No current facility-administered medications for this visit.    PHYSICAL EXAMINATION: ECOG PERFORMANCE STATUS: 1 - Symptomatic but completely ambulatory  Vitals:   12/28/22 0833  BP: (!) 143/93  Pulse: 94  Resp: 18  Temp: (!) 97.5 F (36.4 C)  SpO2: 98%   Filed Weights   12/28/22 0833  Weight: 192 lb 8 oz (87.3 kg)      LABORATORY DATA:  I have reviewed the data as listed    Latest Ref Rng & Units 09/16/2017    4:56 AM 09/14/2017    9:06 PM 07/06/2017    8:20 AM  CMP  Glucose 70 - 99 mg/dL 90  220  254   BUN 6 - 20 mg/dL 5  6  10    Creatinine 0.44 - 1.00 mg/dL 2.70  6.23  7.62   Sodium 135 - 145 mmol/L 142  140  142   Potassium 3.5 - 5.1 mmol/L 3.9  3.5  4.4   Chloride 98 - 111 mmol/L 109  108  109   CO2 22 - 32 mmol/L 24  23  22    Calcium 8.9 - 10.3 mg/dL 8.4  8.6  8.9   Total Protein 6.5 - 8.1 g/dL  6.6  6.8   Total Bilirubin 0.3 - 1.2 mg/dL  0.8  0.4   Alkaline Phos 38 - 126 U/L  86  81   AST 15 - 41 U/L  24  25   ALT 0 - 44 U/L  23  25     Lab Results  Component Value Date   WBC 13.7 (H) 09/26/2017   HGB 11.4 (L) 09/26/2017   HCT 37.6 09/26/2017   MCV 88.3 09/26/2017   PLT 360 09/26/2017   NEUTROABS 9.7 (H) 09/24/2017    ASSESSMENT & PLAN:  Malignant neoplasm of upper-inner quadrant of right breast in female, estrogen receptor positive (HCC) 06/29/2017:Screening detected right breast mass at 2 o'clock position 0.7 cm, axillary ultrasound negative.  Biopsy revealed IDC  grade 1 ER 90%, PR 100%, Ki-67 5%, HER-2 negative ratio 1.19, T1 BN 0 stage I a clinical stage AJCC 8   07/25/2017: Right lumpectomy: IDC grade 2, 1 cm, margins negative, negative for lymphovascular or perineural invasion, 0/2 lymph nodes negative, ER 70%, PR 100%, HER-2 negative ratio 1.19, Ki-67 5%, T1 be N0 stage Ia Oncotype score 16: Distal recurrence risk at 9 years: 4% with antiestrogen therapy Adjuvant radiation therapy 08/30/2017 to 10/13/2017   Current treatment: Adjuvant antiestrogen therapy with tamoxifen 20 mg daily started 10/13/2017   Tamoxifen toxicities: Because of her body aches and pains, skin and vaginal dryness, decreased interest in sex, emotional changes etc. we decided to discontinue tamoxifen at this time having completed 5 years.   Breast cancer surveillance:  09/20/2022: mammogram: No evidence of malignancy breast density category B  She works with an Engineer, site   Return to clinic on an as-needed basis. ------------------------------------- Assessment and Plan    Breast Cancer Survivorship Patient has completed 5.5 years of Tamoxifen therapy with side effects including hot flashes, dry skin, vaginal dryness, and decreased libido. Discussed the risks/benefits of discontinuing Tamoxifen given the patient's lower risk for breast cancer recurrence. -Discontinue Tamoxifen. -Consider use of topical estrogen or over-the-counter Replens for persistent vaginal dryness.  Menopause Patient is experiencing symptoms consistent with menopause, including hot flashes and vaginal dryness. Tamoxifen discontinuation may alleviate some symptoms. -Consider use of topical estrogen or over-the-counter Replens for persistent vaginal dryness.  Breast Cancer Surveillance Patient has been keeping up with annual mammograms, with less dense breasts (category D) which should allow for effective mammogram surveillance. -Continue annual mammograms in August. -Continue regular breast exams  with gynecologist. -Follow-up on an as-needed basis with oncology.        No orders of the defined types were placed in this encounter.  The patient has a good understanding of the overall plan. she agrees with it. she will call with any problems that may develop before the next visit here. Total time spent: 30 mins including face to face time and time spent for planning, charting and co-ordination of care   Tamsen Meek, MD 12/28/22

## 2022-12-28 NOTE — Assessment & Plan Note (Addendum)
06/29/2017:Screening detected right breast mass at 2 o'clock position 0.7 cm, axillary ultrasound negative.  Biopsy revealed IDC grade 1 ER 90%, PR 100%, Ki-67 5%, HER-2 negative ratio 1.19, T1 BN 0 stage I a clinical stage AJCC 8   07/25/2017: Right lumpectomy: IDC grade 2, 1 cm, margins negative, negative for lymphovascular or perineural invasion, 0/2 lymph nodes negative, ER 70%, PR 100%, HER-2 negative ratio 1.19, Ki-67 5%, T1 be N0 stage Ia Oncotype score 16: Distal recurrence risk at 9 years: 4% with antiestrogen therapy Adjuvant radiation therapy 08/30/2017 to 10/13/2017   Current treatment: Adjuvant antiestrogen therapy with tamoxifen 20 mg daily started 10/13/2017   Tamoxifen toxicities: Because of her body aches and pains, skin and vaginal dryness, decreased interest in sex, emotional changes etc. we decided to discontinue tamoxifen at this time having completed 5 years.   Breast cancer surveillance:  09/20/2022: mammogram: No evidence of malignancy breast density category B    She works with an Engineer, site   Return to clinic on an as-needed basis.

## 2023-06-27 ENCOUNTER — Encounter (HOSPITAL_COMMUNITY): Payer: Self-pay

## 2023-07-04 ENCOUNTER — Ambulatory Visit: Admitting: Family Medicine

## 2023-07-04 ENCOUNTER — Encounter: Payer: Self-pay | Admitting: Family Medicine

## 2023-07-04 VITALS — BP 131/75 | HR 78 | Temp 98.0°F | Ht 63.0 in | Wt 193.0 lb

## 2023-07-04 DIAGNOSIS — Z23 Encounter for immunization: Secondary | ICD-10-CM | POA: Diagnosis not present

## 2023-07-04 DIAGNOSIS — J301 Allergic rhinitis due to pollen: Secondary | ICD-10-CM | POA: Insufficient documentation

## 2023-07-04 DIAGNOSIS — Z0000 Encounter for general adult medical examination without abnormal findings: Secondary | ICD-10-CM | POA: Diagnosis not present

## 2023-07-04 DIAGNOSIS — Z7185 Encounter for immunization safety counseling: Secondary | ICD-10-CM

## 2023-07-04 DIAGNOSIS — C50211 Malignant neoplasm of upper-inner quadrant of right female breast: Secondary | ICD-10-CM | POA: Diagnosis not present

## 2023-07-04 DIAGNOSIS — Z13220 Encounter for screening for lipoid disorders: Secondary | ICD-10-CM

## 2023-07-04 DIAGNOSIS — Z170 Estrogen receptor positive status [ER+]: Secondary | ICD-10-CM

## 2023-07-04 LAB — CBC WITH DIFFERENTIAL/PLATELET
Basophils Absolute: 0.1 10*3/uL (ref 0.0–0.1)
Basophils Relative: 0.6 % (ref 0.0–3.0)
Eosinophils Absolute: 0.1 10*3/uL (ref 0.0–0.7)
Eosinophils Relative: 1.4 % (ref 0.0–5.0)
HCT: 40.3 % (ref 36.0–46.0)
Hemoglobin: 13.5 g/dL (ref 12.0–15.0)
Lymphocytes Relative: 31.2 % (ref 12.0–46.0)
Lymphs Abs: 2.7 10*3/uL (ref 0.7–4.0)
MCHC: 33.6 g/dL (ref 30.0–36.0)
MCV: 82.7 fl (ref 78.0–100.0)
Monocytes Absolute: 0.4 10*3/uL (ref 0.1–1.0)
Monocytes Relative: 4.2 % (ref 3.0–12.0)
Neutro Abs: 5.3 10*3/uL (ref 1.4–7.7)
Neutrophils Relative %: 62.6 % (ref 43.0–77.0)
Platelets: 272 10*3/uL (ref 150.0–400.0)
RBC: 4.86 Mil/uL (ref 3.87–5.11)
RDW: 14.2 % (ref 11.5–15.5)
WBC: 8.5 10*3/uL (ref 4.0–10.5)

## 2023-07-04 LAB — COMPREHENSIVE METABOLIC PANEL WITH GFR
ALT: 17 U/L (ref 0–35)
AST: 22 U/L (ref 0–37)
Albumin: 4 g/dL (ref 3.5–5.2)
Alkaline Phosphatase: 79 U/L (ref 39–117)
BUN: 14 mg/dL (ref 6–23)
CO2: 25 meq/L (ref 19–32)
Calcium: 8.7 mg/dL (ref 8.4–10.5)
Chloride: 107 meq/L (ref 96–112)
Creatinine, Ser: 0.7 mg/dL (ref 0.40–1.20)
GFR: 98.51 mL/min (ref >60.00)
Glucose, Bld: 88 mg/dL (ref 70–99)
Potassium: 3.6 meq/L (ref 3.5–5.1)
Sodium: 140 meq/L (ref 135–145)
Total Bilirubin: 0.4 mg/dL (ref 0.2–1.2)
Total Protein: 6.8 g/dL (ref 6.0–8.3)

## 2023-07-04 LAB — LIPID PANEL
Cholesterol: 169 mg/dL (ref 0–200)
HDL: 54.9 mg/dL (ref >39.00)
LDL Cholesterol: 90 mg/dL (ref 0–99)
NonHDL: 113.75
Total CHOL/HDL Ratio: 3
Triglycerides: 117 mg/dL (ref 0.0–149.0)
VLDL: 23.4 mg/dL (ref 0.0–40.0)

## 2023-07-04 MED ORDER — MELOXICAM 15 MG PO TABS: 15.0000 mg | ORAL_TABLET | Freq: Every day | ORAL | Status: AC

## 2023-07-04 NOTE — Progress Notes (Signed)
 Subjective  Chief Complaint  Patient presents with   Establish Care    HPI: Melissa [name] is a 53 y.o. female who presents to [facility] [place] today for a Female Wellness Visit. She also has the concerns and/or needs as listed above in the chief complaint. These will be addressed in addition to the Health Maintenance Visit.   Wellness Visit: annual visit with health maintenance review and exam  HM: sees GYN: female wellness is up to date. Eligible for shingrix . Education given. Tennis for exercise. Weight gain over the last several years has been difficult.  Chronic disease f/u and/or acute problem visit: (deemed necessary to be done in addition to the wellness visit): Breast cancer dxd in 2019; reviewed oncology notes; lumpectomy, rads tx and tamoxifen  x 5.5 years; just discontinued due to side effects: hot flushes, weight gain.  Allergies on allegra are controlled.   Assessment  1. Annual physical exam   2. Malignant neoplasm of upper-inner quadrant of right breast in female, estrogen receptor positive (HCC) 2019, lumpectomy, rad tx and tamoxifen  x 5   3. Seasonal allergic rhinitis due to pollen   4. Need for shingles vaccine   5. Vaccine counseling      Plan  Female Wellness Visit: Age appropriate Health Maintenance and Prevention measures were discussed with patient. Included topics are cancer screening recommendations, ways to keep healthy (see AVS) including dietary and exercise recommendations, regular eye and dental care, use of seat belts, and avoidance of moderate alcohol use and tobacco use.  BMI: discussed patient's BMI and encouraged positive lifestyle modifications to help get to or maintain a target BMI. HM needs and immunizations were addressed and ordered. See below for orders. See HM and immunization section for updates. Shingrix  #1 given today Routine labs and screening tests ordered including cmp, cbc and lipids where appropriate. Discussed  recommendations regarding Vit D and calcium supplementation (see AVS)  Chronic disease management visit and/or acute problem visit: Breast cancer: released from onc w/ nl f/u. Neg genetic testing. Doing well. Perimenopausal: monitor and education given. Weight mgt: discussed diet and exercise.   Follow up: 12 mo for cpe  Orders Placed This Encounter  Procedures   Zoster Recombinant (Shingrix  )   CBC with Differential/Platelet   Comprehensive metabolic panel with GFR   Lipid panel   TSH   HM PAP SMEAR   Meds ordered this encounter  Medications   meloxicam  (MOBIC ) 15 MG tablet    Sig: Take 1 tablet (15 mg total) by mouth daily. Prn      Body mass index is 34.19 kg/m. Wt Readings from Last 3 Encounters:  07/04/23 193 lb (87.5 kg)  12/28/22 192 lb 8 oz (87.3 kg)  08/05/22 190 lb 6.4 oz (86.4 kg)     Patient Active Problem List   Diagnosis Date Noted Date Diagnosed   Seasonal allergic rhinitis due to pollen 07/04/2023    S/P lumpectomy, right breast 09/14/2017    Genetic testing 07/13/2017     Negative genetic testing on the 9 gene STAT panel.  The STAT Breast cancer panel offered by [facility] includes sequencing and rearrangement analysis for the following 9 genes:  ATM, BRCA1, BRCA2, CDH1, CHEK2, PALB2, PTEN, STK11 and TP53.   The report date is Jul 13, 2017.  The Multi-Gene Panel offered by [facility] includes sequencing and/or deletion duplication testing of the following 83 genes: ALK, APC, ATM, AXIN2,BAP1,  BARD1, BLM, BMPR1A, BRCA1, BRCA2, BRIP1, CASR, CDC73, CDH1, CDK4,  CDKN1B, CDKN1C, CDKN2A (p14ARF), CDKN2A (p16INK4a), CEBPA, CHEK2, CTNNA1, DICER1, DIS3L2, EGFR (c.2369C>T, p.Thr790Met variant only), EPCAM (Deletion/duplication testing only), FH, FLCN, GATA2, GPC3, GREM1 (Promoter region deletion/duplication testing only), HOXB13 (c.251G>A, p.Gly84Glu), HRAS, KIT, MAX, MEN1, MET, MITF (c.952G>A, p.Glu318Lys variant only), MLH1, MSH2, MSH3, MSH6, MUTYH, NBN, NF1, NF2, NTHL1,  PALB2, PDGFRA, PHOX2B, PMS2, POLD1, POLE, POT1, PRKAR1A, PTCH1, PTEN, RAD50, RAD51C, RAD51D, RB1, RECQL4, RET, RUNX1, SDHAF2, SDHA (sequence changes only), SDHB, SDHC, SDHD, SMAD4, SMARCA4, SMARCB1, SMARCE1, STK11, SUFU, TERT, TERT, TMEM127, TP53, TSC1, TSC2, VHL, WRN and WT1.   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  The report date is Jul 13, 2017.    Family history of prostate cancer     Malignant neoplasm of upper-inner quadrant of right breast in female, estrogen receptor positive (HCC) 2019, lumpectomy, rad tx and tamoxifen  x 5 07/06/2017    Health Maintenance  Topic Date Due   Hepatitis C Screening  Never done   DTaP/Tdap/Td (1 - Tdap) Never done   COVID-19 Vaccine (3 - Pfizer risk series) 07/20/2023 (Originally 07/16/2019)   Zoster Vaccines- Shingrix  (2 of 2) 08/29/2023   INFLUENZA VACCINE  09/16/2023   MAMMOGRAM  09/17/2023   Cervical Cancer Screening (HPV/Pap Cotest)  07/17/2026   Colonoscopy  02/16/2031   HIV Screening  Completed   HPV VACCINES  Aged Out   Meningococcal B Vaccine  Aged Out   Immunization History  Administered Date(s) Administered   PFIZER(Purple Top)SARS-COV-2 Vaccination 05/24/2019, 06/18/2019   Zoster Recombinant(Shingrix ) 07/04/2023   We updated and reviewed the patient's past history in detail and it is documented below. Allergies: Patient has no known allergies. Past Medical History Patient  has a past medical history of Cancer (HCC) (06/2017), Family history of prostate cancer, History of radiation therapy (08/29/17- 09/13/17 and 10/04/17- 10/13/17), and Personal history of radiation therapy. Past Surgical History Patient  has a past surgical history that includes Cesarean section (2006); Breast lumpectomy with radioactive seed and sentinel lymph node biopsy (Right, 07/25/2017); Laparoscopic cholecystectomy (2010); Breast biopsy (Right, 06/2017); and Breast lumpectomy (Right, 07/25/2017). Family History: Patient family history  includes Arthritis in her paternal grandmother; Cancer in her brother and maternal grandmother; Diabetes in her father, mother, and paternal grandfather; Heart attack (age of onset: 10) in her mother; Hypertension in her father and mother; Kidney disease in her father; Kidney failure in her father and paternal grandfather; Lung cancer in her paternal uncle; Osteoporosis in her paternal grandmother; Prostate cancer in her father. Social History:  Patient  reports that she has never smoked. She has never used smokeless tobacco. She reports current alcohol use of about 1.0 standard drink of alcohol per week. She reports that she does not use drugs.  Review of Systems: Constitutional: negative for fever or malaise Ophthalmic: negative for photophobia, double vision or loss of vision Cardiovascular: negative for chest pain, dyspnea on exertion, or new LE swelling Respiratory: negative for SOB or persistent cough Gastrointestinal: negative for abdominal pain, change in bowel habits or melena Genitourinary: negative for dysuria or gross hematuria, no abnormal uterine bleeding or disharge Musculoskeletal: negative for new gait disturbance or muscular weakness Integumentary: negative for new or persistent rashes, no breast lumps Neurological: negative for TIA or stroke symptoms Psychiatric: negative for SI or delusions Allergic/Immunologic: negative for hives  Patient Care Team    Relationship Specialty Notifications Start End  Luevenia Saha, MD PCP - General Family Medicine  07/04/23   Juanita Norlander, MD Consulting Physician General Surgery  07/04/17   Lee Public,  Clem Currier, MD Consulting Physician Hematology and Oncology  07/04/17   Colie Dawes, MD Attending Physician Radiation Oncology  07/04/17   Liane Redman, MD Consulting Physician Infectious Diseases  09/26/17   Percival Brace, NP Nurse Practitioner Hematology and Oncology  01/18/18   Arnoldo Lapping, MD Consulting Physician Cardiology   07/04/23     Objective  Vitals: BP 131/75   Pulse 78   Temp 98 F (36.7 C)   Ht 5\' 3"  (1.6 m)   Wt 193 lb (87.5 kg)   SpO2 98%   BMI 34.19 kg/m  General:  Well developed, well nourished, no acute distress  Psych:  Alert and orientedx3,normal mood and affect HEENT:  Normocephalic, atraumatic, non-icteric sclera,  supple neck without adenopathy, mass or thyromegaly Cardiovascular:  Normal S1, S2, RRR without gallop, rub or murmur Respiratory:  Good breath sounds bilaterally, CTAB with normal respiratory effort Gastrointestinal: normal bowel sounds, soft, non-tender, no noted masses. No HSM MSK: extremities without edema, joints without erythema or swelling Neurologic:    Mental status is normal.  Gross motor and sensory exams are normal.  No tremor  Commons side effects, risks, benefits, and alternatives for medications and treatment plan prescribed today were discussed, and the patient expressed understanding of the given instructions. Patient is instructed to call or message via MyChart if he/she has any questions or concerns regarding our treatment plan. No barriers to understanding were identified. We discussed Red Flag symptoms and signs in detail. Patient expressed understanding regarding what to do in case of urgent or emergency type symptoms.  Medication list was reconciled, printed and provided to the patient in AVS. Patient instructions and summary information was reviewed with the patient as documented in the AVS. This note was prepared with assistance of Dragon voice recognition software. Occasional wrong-word or sound-a-like substitutions may have occurred due to the inherent limitations of voice recognition software

## 2023-07-04 NOTE — Patient Instructions (Signed)
Please return in 12 months for your annual complete physical; please come fasting.   I will release your lab results to you on your MyChart account with further instructions. You may see the results before I do, but when I review them I will send you a message with my report or have my assistant call you if things need to be discussed. Please reply to my message with any questions. Thank you!   If you have any questions or concerns, please don't hesitate to send me a message via MyChart or call the office at [phone]. Thank you for visiting with us today! It's our pleasure caring for you.   Please do these things to maintain good health!  Exercise at least 30-45 minutes a day,  4-5 days a week.  Eat a low-fat diet with lots of fruits and vegetables, up to 7-9 servings per day. Drink plenty of water daily. Try to drink 8 8oz glasses per day. Seatbelts can save your life. Always wear your seatbelt. Place Smoke Detectors on every level of your home and check batteries every year. Schedule an appointment with an eye doctor for an eye exam every 1-2 years Safe sex - use condoms to protect yourself from STDs if you could be exposed to these types of infections. Use birth control if you do not want to become pregnant and are sexually active. Avoid heavy alcohol use. If you drink, keep it to less than 2 drinks/day and not every day. Health Care Power of Attorney.  Choose someone you trust that could speak for you if you became unable to speak for yourself. Depression is common in our stressful world.If you're feeling down or losing interest in things you normally enjoy, please come in for a visit. If anyone is threatening or hurting you, please get help. Physical or Emotional Violence is never OK.   

## 2023-07-05 ENCOUNTER — Ambulatory Visit: Payer: Self-pay | Admitting: Family Medicine

## 2023-07-05 LAB — TSH: TSH: 1.78 u[IU]/mL (ref 0.35–5.50)

## 2023-07-05 NOTE — Progress Notes (Signed)
 See mychart note The 10-year ASCVD risk score (Arnett DK, et al., 2019) is: 1.4%   Values used to calculate the score:     Age: 53 years     Sex: Female     Is Non-Hispanic African American: No     Diabetic: No     Tobacco smoker: No     Systolic Blood Pressure: 131 mmHg     Is BP treated: No     HDL Cholesterol: 54.9 mg/dL     Total Cholesterol: 169 mg/dL

## 2023-07-22 LAB — HM PAP SMEAR: HPV, high-risk: NEGATIVE

## 2023-09-06 ENCOUNTER — Ambulatory Visit

## 2023-09-20 ENCOUNTER — Ambulatory Visit

## 2023-10-19 ENCOUNTER — Ambulatory Visit

## 2023-10-19 DIAGNOSIS — Z23 Encounter for immunization: Secondary | ICD-10-CM | POA: Diagnosis not present

## 2023-10-19 NOTE — Progress Notes (Signed)
.  Patient is in office today for a nurse visit for shingle vaccine #2. Administered in the Left deltoid area without any complaints.

## 2023-11-15 ENCOUNTER — Other Ambulatory Visit: Payer: Self-pay | Admitting: Family Medicine

## 2023-11-15 DIAGNOSIS — Z0000 Encounter for general adult medical examination without abnormal findings: Secondary | ICD-10-CM

## 2023-11-30 ENCOUNTER — Ambulatory Visit
Admission: RE | Admit: 2023-11-30 | Discharge: 2023-11-30 | Disposition: A | Discharge status: Home / Self Care | Source: Ambulatory Visit | Attending: Family Medicine | Admitting: Family Medicine

## 2023-11-30 DIAGNOSIS — Z0000 Encounter for general adult medical examination without abnormal findings: Secondary | ICD-10-CM
